# Patient Record
Sex: Male | Born: 1955 | Race: White | Hispanic: No | Marital: Married | Smoking: Current every day smoker
Health system: Southern US, Community
[De-identification: ages and names within clinical notes are randomized; demographics above are authoritative.]

## PROBLEM LIST (undated history)

## (undated) DIAGNOSIS — I10 Essential (primary) hypertension: Secondary | ICD-10-CM

## (undated) DIAGNOSIS — I714 Abdominal aortic aneurysm, without rupture, unspecified: Secondary | ICD-10-CM

## (undated) DIAGNOSIS — E785 Hyperlipidemia, unspecified: Secondary | ICD-10-CM

## (undated) DIAGNOSIS — K859 Acute pancreatitis without necrosis or infection, unspecified: Secondary | ICD-10-CM

## (undated) HISTORY — DX: Hyperlipidemia, unspecified: E78.5

## (undated) HISTORY — DX: Essential (primary) hypertension: I10

## (undated) HISTORY — DX: Abdominal aortic aneurysm, without rupture: I71.4

## (undated) HISTORY — DX: Acute pancreatitis without necrosis or infection, unspecified: K85.90

## (undated) HISTORY — DX: Abdominal aortic aneurysm, without rupture, unspecified: I71.40

---

## 2004-01-08 ENCOUNTER — Inpatient Hospital Stay (HOSPITAL_COMMUNITY): Admission: EM | Admit: 2004-01-08 | Discharge: 2004-01-12 | Payer: Self-pay | Admitting: Emergency Medicine

## 2006-12-24 HISTORY — PX: CHOLECYSTECTOMY: SHX55

## 2007-01-07 ENCOUNTER — Ambulatory Visit: Payer: Self-pay | Admitting: Surgery

## 2007-02-08 ENCOUNTER — Encounter: Admission: RE | Admit: 2007-02-08 | Discharge: 2007-02-08 | Payer: Self-pay | Admitting: Surgery

## 2007-02-11 ENCOUNTER — Ambulatory Visit: Payer: Self-pay | Admitting: Surgery

## 2007-10-07 ENCOUNTER — Ambulatory Visit: Payer: Self-pay | Admitting: Surgery

## 2007-10-07 ENCOUNTER — Encounter: Admission: RE | Admit: 2007-10-07 | Discharge: 2007-10-07 | Payer: Self-pay | Admitting: Surgery

## 2008-04-27 ENCOUNTER — Ambulatory Visit: Payer: Self-pay | Admitting: Surgery

## 2008-11-02 ENCOUNTER — Encounter: Admission: RE | Admit: 2008-11-02 | Discharge: 2008-11-02 | Payer: Self-pay | Admitting: Surgery

## 2008-11-02 ENCOUNTER — Ambulatory Visit: Payer: Self-pay | Admitting: Surgery

## 2008-11-18 ENCOUNTER — Observation Stay (HOSPITAL_COMMUNITY): Admission: RE | Admit: 2008-11-18 | Discharge: 2008-11-19 | Payer: Self-pay | Admitting: Surgery

## 2008-11-25 ENCOUNTER — Ambulatory Visit (HOSPITAL_COMMUNITY): Admission: RE | Admit: 2008-11-25 | Discharge: 2008-11-25 | Payer: Self-pay | Admitting: Surgery

## 2009-02-04 ENCOUNTER — Inpatient Hospital Stay (HOSPITAL_COMMUNITY): Admission: RE | Admit: 2009-02-04 | Discharge: 2009-02-05 | Payer: Self-pay | Admitting: Surgery

## 2009-02-04 ENCOUNTER — Ambulatory Visit: Payer: Self-pay | Admitting: Surgery

## 2009-02-04 HISTORY — PX: ABDOMINAL AORTIC ANEURYSM REPAIR: SUR1152

## 2009-03-08 ENCOUNTER — Encounter: Admission: RE | Admit: 2009-03-08 | Discharge: 2009-03-08 | Payer: Self-pay | Admitting: Surgery

## 2009-03-08 ENCOUNTER — Ambulatory Visit: Payer: Self-pay | Admitting: Surgery

## 2010-05-15 ENCOUNTER — Encounter: Payer: Self-pay | Admitting: Surgery

## 2010-07-28 LAB — COMPREHENSIVE METABOLIC PANEL
ALT: 31 U/L (ref 0–53)
AST: 23 U/L (ref 0–37)
Alkaline Phosphatase: 62 U/L (ref 39–117)
BUN: 28 mg/dL — ABNORMAL HIGH (ref 6–23)
CO2: 13 mEq/L — ABNORMAL LOW (ref 19–32)
Calcium: 9.5 mg/dL (ref 8.4–10.5)
Chloride: 106 mEq/L (ref 96–112)
Creatinine, Ser: 1.33 mg/dL (ref 0.4–1.5)
GFR calc non Af Amer: 56 mL/min — ABNORMAL LOW (ref >60)
Glucose, Bld: 123 mg/dL — ABNORMAL HIGH (ref 70–99)

## 2010-07-28 LAB — BLOOD GAS, ARTERIAL
Bicarbonate: 17.1 mEq/L — ABNORMAL LOW (ref 20.0–24.0)
FIO2: 0.21 %
TCO2: 18.1 mmol/L (ref 0–100)
pH, Arterial: 7.372 (ref 7.350–7.450)
pO2, Arterial: 83.6 mmHg (ref 80.0–100.0)

## 2010-07-28 LAB — GLUCOSE, CAPILLARY: Glucose-Capillary: 125 mg/dL — ABNORMAL HIGH (ref 70–99)

## 2010-07-28 LAB — BASIC METABOLIC PANEL
CO2: 23 mEq/L (ref 19–32)
Calcium: 8.8 mg/dL (ref 8.4–10.5)
Chloride: 103 mEq/L (ref 96–112)
GFR calc Af Amer: >60 mL/min (ref >60)
Potassium: 4.5 mEq/L (ref 3.5–5.1)
Sodium: 133 mEq/L — ABNORMAL LOW (ref 135–145)

## 2010-07-28 LAB — CBC
HCT: 36.4 % — ABNORMAL LOW (ref 39.0–52.0)
HCT: 39.6 % (ref 39.0–52.0)
Hemoglobin: 12.4 g/dL — ABNORMAL LOW (ref 13.0–17.0)
MCHC: 34.1 g/dL (ref 30.0–36.0)
MCV: 84.6 fL (ref 78.0–100.0)
MCV: 85 fL (ref 78.0–100.0)
Platelets: 362 10*3/uL (ref 150–400)
RBC: 4.28 MIL/uL (ref 4.22–5.81)
RBC: 4.68 MIL/uL (ref 4.22–5.81)
RDW: 13.7 % (ref 11.5–15.5)
WBC: 19.8 10*3/uL — ABNORMAL HIGH (ref 4.0–10.5)

## 2010-07-28 LAB — URINALYSIS, ROUTINE W REFLEX MICROSCOPIC
Glucose, UA: NEGATIVE mg/dL
Nitrite: NEGATIVE
pH: 5 (ref 5.0–8.0)

## 2010-07-28 LAB — PROTIME-INR: INR: 0.94 (ref 0.00–1.49)

## 2010-07-28 LAB — TYPE AND SCREEN: ABO/RH(D): A POS

## 2010-07-30 LAB — CBC
HCT: 37.6 % — ABNORMAL LOW (ref 39.0–52.0)
MCV: 85.5 fL (ref 78.0–100.0)
Platelets: 348 10*3/uL (ref 150–400)
RDW: 13.8 % (ref 11.5–15.5)

## 2010-07-30 LAB — URINALYSIS, ROUTINE W REFLEX MICROSCOPIC
Nitrite: NEGATIVE
Specific Gravity, Urine: 1.009 (ref 1.005–1.030)
Urobilinogen, UA: 0.2 mg/dL (ref 0.0–1.0)

## 2010-07-30 LAB — TYPE AND SCREEN
ABO/RH(D): A POS
Antibody Screen: NEGATIVE

## 2010-07-30 LAB — BLOOD GAS, ARTERIAL
Patient temperature: 98.6
TCO2: 20.4 mmol/L (ref 0–100)
pH, Arterial: 7.408 (ref 7.350–7.450)

## 2010-07-30 LAB — COMPREHENSIVE METABOLIC PANEL
Albumin: 3.6 g/dL (ref 3.5–5.2)
BUN: 11 mg/dL (ref 6–23)
Calcium: 9.5 mg/dL (ref 8.4–10.5)
Creatinine, Ser: 1.05 mg/dL (ref 0.4–1.5)
Potassium: 4.3 mEq/L (ref 3.5–5.1)
Total Protein: 6.9 g/dL (ref 6.0–8.3)

## 2010-07-30 LAB — PROTIME-INR: INR: 0.9 (ref 0.00–1.49)

## 2010-07-30 LAB — APTT: aPTT: 27 seconds (ref 24–37)

## 2010-07-31 LAB — CBC
HCT: 37.1 % — ABNORMAL LOW (ref 39.0–52.0)
HCT: 37.4 % — ABNORMAL LOW (ref 39.0–52.0)
Hemoglobin: 12.2 g/dL — ABNORMAL LOW (ref 13.0–17.0)
Hemoglobin: 12.8 g/dL — ABNORMAL LOW (ref 13.0–17.0)
Hemoglobin: 12.9 g/dL — ABNORMAL LOW (ref 13.0–17.0)
MCHC: 34.3 g/dL (ref 30.0–36.0)
MCV: 85.7 fL (ref 78.0–100.0)
MCV: 85.8 fL (ref 78.0–100.0)
MCV: 86.8 fL (ref 78.0–100.0)
Platelets: 352 10*3/uL (ref 150–400)
RBC: 4.1 MIL/uL — ABNORMAL LOW (ref 4.22–5.81)
RBC: 4.36 MIL/uL (ref 4.22–5.81)
RDW: 13.7 % (ref 11.5–15.5)
WBC: 16.5 10*3/uL — ABNORMAL HIGH (ref 4.0–10.5)
WBC: 16.7 10*3/uL — ABNORMAL HIGH (ref 4.0–10.5)

## 2010-07-31 LAB — BASIC METABOLIC PANEL
CO2: 23 mEq/L (ref 19–32)
Calcium: 9 mg/dL (ref 8.4–10.5)
Chloride: 106 mEq/L (ref 96–112)
Creatinine, Ser: 1.02 mg/dL (ref 0.4–1.5)
GFR calc Af Amer: >60 mL/min (ref >60)
Glucose, Bld: 129 mg/dL — ABNORMAL HIGH (ref 70–99)

## 2010-07-31 LAB — BLOOD GAS, ARTERIAL
Acid-base deficit: 4.4 mmol/L — ABNORMAL HIGH (ref 0.0–2.0)
Bicarbonate: 19.6 mEq/L — ABNORMAL LOW (ref 20.0–24.0)
Patient temperature: 98.6
TCO2: 20.5 mmol/L (ref 0–100)
pCO2 arterial: 32.1 mmHg — ABNORMAL LOW (ref 35.0–45.0)

## 2010-07-31 LAB — URINALYSIS, ROUTINE W REFLEX MICROSCOPIC
Bilirubin Urine: NEGATIVE
Hgb urine dipstick: NEGATIVE
Ketones, ur: NEGATIVE mg/dL
Nitrite: NEGATIVE
Protein, ur: NEGATIVE mg/dL
Specific Gravity, Urine: 1.019 (ref 1.005–1.030)
Urobilinogen, UA: 0.2 mg/dL (ref 0.0–1.0)

## 2010-07-31 LAB — DIFFERENTIAL
Eosinophils Absolute: 0.3 10*3/uL (ref 0.0–0.7)
Eosinophils Relative: 2 % (ref 0–5)
Lymphocytes Relative: 24 % (ref 12–46)
Lymphs Abs: 4 10*3/uL (ref 0.7–4.0)
Monocytes Relative: 8 % (ref 3–12)

## 2010-07-31 LAB — GLUCOSE, CAPILLARY: Glucose-Capillary: 140 mg/dL — ABNORMAL HIGH (ref 70–99)

## 2010-07-31 LAB — PROTIME-INR
INR: 0.9 (ref 0.00–1.49)
Prothrombin Time: 12.5 seconds (ref 11.6–15.2)

## 2010-07-31 LAB — COMPREHENSIVE METABOLIC PANEL
Albumin: 3.7 g/dL (ref 3.5–5.2)
BUN: 23 mg/dL (ref 6–23)
Chloride: 106 mEq/L (ref 96–112)
Creatinine, Ser: 1.13 mg/dL (ref 0.4–1.5)
GFR calc non Af Amer: >60 mL/min (ref >60)
Glucose, Bld: 147 mg/dL — ABNORMAL HIGH (ref 70–99)
Total Bilirubin: 0.7 mg/dL (ref 0.3–1.2)

## 2010-07-31 LAB — APTT: aPTT: 30 seconds (ref 24–37)

## 2010-09-06 NOTE — Assessment & Plan Note (Signed)
OFFICE VISIT   George Webb, George Webb  DOB:  1956-02-27                                       04/27/2008  JXBJY#:[id number]   REASON FOR VISIT:  Follow-up aneurysm.   HISTORY:  This is a 55 year old gentleman with asymptomatic abdominal  aortic aneurysm that I initially met in October of 2008.  We have been  following this, he comes in today with a CT scan.  He remains  asymptomatic.   PHYSICAL EXAMINATION:  Blood pressure is 101/69, pulse is 109.  General:  He is well-appearing, in no distress.  Abdomen:  Soft, no pulsatile mass  felt, he is obese.  His extremities are warm and well-perfused.   DIAGNOSTIC STUDIES:  The patient comes in with a report from a CT scan  obtained at [facility].  This reveals maximum aneurysm diameter  of 5 cm, infrarenal neck measures 3.2 cm.   ASSESSMENT:  Infrarenal abdominal aneurysm.   PLAN:  Based on the patient's size of his aneurysm I do not recommend  repair at this time.  I told him he has the risk of approximately 1%  risk of rupture over the next year based on the size of his aneurysm.  I  think that we are better off following this as it has been stable over  time.  He is told if he has significant abdominal or back pain to go  straight to the emergency department.   I think the patient is a candidate for either endovascular or open  repair, however, given his body habitus I think he would do better with  an endovascular repair.  I have encouraged him to start an exercise  program, eat healthier and try to lose weight.  I will see him back in 6  months with a CT angiogram.   Jorge Ny, MD  Electronically Signed   VWB/MEDQ  D:  04/27/2008  T:  04/28/2008  Job:  [id number]   cc:   Fara Chute

## 2010-09-06 NOTE — Assessment & Plan Note (Signed)
OFFICE VISIT   JAWAD, WIACEK  DOB:  05-23-1955                                       10/07/2007  WNUUV#:[id number]   REASON FOR VISIT:  Followup aneurysm.   HISTORY:  This is a 55 year old gentleman with asymptomatic abdominal  aortic aneurysm.  He comes back in today for followup and review of his  CT scan.  Since I last saw him, he has not had any symptoms such as  abdominal pain, chest pain.   PHYSICAL EXAMINATION:  His blood pressure is 119/74, pulse 96,  respirations 82.  General:  He is well-appearing in no acute distress.  Abdomen:  Soft, nontender.  Extremities are warm and well-perfused.   DIAGNOSTIC STUDIES:  The patient had a CT scan today.  This was  reviewed.  His aneurysm in short axis measures 45 mm and long axis  measuring 49 mm.   ASSESSMENT/PLAN:  Infrarenal abdominal aneurysm.   PLAN:  At this time, I do not feel like the patient meets size criteria  to proceed with endovascular versus open repair of his aneurysm.  For  that reason, I am scheduling him to have a repeat CT scan in 6 months.  The patient is told that if he has any severe abdominal pain, that he  should contact EMS.  I told him the risk of his aneurysm rupturing  within the next year is less than 1%.  Again, will follow up in 6 months  with a repeat CT scan.   Jorge Ny, MD  Electronically Signed   VWB/MEDQ  D:  10/07/2007  T:  10/08/2007  Job:  [id number]

## 2010-09-06 NOTE — Assessment & Plan Note (Signed)
OFFICE VISIT   BECKET, WECKER  DOB:  1955-06-18                                       01/07/2007  ZOXWR#:[id number]   REASON FOR VISIT:  Abdominal aortic aneurysm.   HISTORY:  This is a 55 year old gentleman who had been complaining of  epigastric pain, underwent ultrasound for biliary pathology and an  incidental finding was a 4.29x4.45 cm abdominal aortic aneurysm.  The  patient subsequently had his gallbladder out.  He does not have  complaint of abdominal pain consistent with a symptomatic aneurysm.   REVIEW OF SYSTEMS:  Loss of appetite and weight loss.  He weighs 276  pounds.  CARDIAC:  No chest pain, tightness, or shortness of breath.  GASTROINTESTINAL:  He complains of reflux, diarrhea, and constipation,  which is long-standing.  VASCULAR:  No evidence of TIAs.  NEURO:  No seizures.   PAST MEDICAL HISTORY:  Diabetes, high blood pressure,  hypercholesterolemia, psoriasis, pancreatitis.   PAST SURGICAL HISTORY:  Lap-chole.  Scrotal abscess drainage.  Right ear  surgery.  Right knee surgery.   FAMILY HISTORY:  Negative for cardiovascular disease.   SOCIAL HISTORY:  Married with 2 children.  Works as a Therapist, sports.  Currently does not smoke, but has a history of tobacco.  Does not drink  alcohol.   PHYSICAL EXAM:  Heart rate is 100, blood pressure 149/97.  In general,  well-appearing, in no acute distress.  HEENT is normocephalic,  atraumatic.  No carotid bruits.  Cardiovascular is regular rate and  rhythm.  Respirations are not labored.  Lungs are clear.  Abdomen is  soft and nontender.  Obese.  Extremities, he has palpable femoral  pulses.  Extremities were warm with no ulceration.   ASSESSMENT AND PLAN:  This is a 55 year old with a 4.4 cm abdominal  aortic aneurysm.  Plan:  At this time, the patient is asymptomatic.  He  needs to be further evaluated with a CT angiogram.  Would also like to  evaluate his lower extremities for popliteal  aneurysms and femoral  aneurysms.  Will also get a carotid duplex. These should all be  scheduled in the next month, and the patient will follow up after that.  We discussed the signs of rupture or symptomatic pain, and he will come  to the emergency room if either of these occur.   Jorge Ny, MD  Electronically Signed   VWB/MEDQ  D:  01/07/2007  T:  01/08/2007  Job:  [id number]

## 2010-09-06 NOTE — Assessment & Plan Note (Signed)
OFFICE VISIT   Cowgill, Montrey L  DOB:  08-17-1955                                       03/08/2009  XBJYN#:[id number]   REASON FOR VISIT:  Follow up aneurysm.   HISTORY:  This is a 55 year old gentleman who is status post  endovascular aneurysm repair on 02/04/09.  His postoperative course was  uncomplicated.  He was discharged home the following day.  He comes back  in today for his first postoperative visit.  CT scan was performed  earlier today.  I do not see any endoleak.   On examination, he has a slight wound separation, 2 mm at most, on the  lateral aspect of his left groin.  There is no evidence of infection.  His extremities are warm and well-perfused.  His abdomen is soft.   At this time, I will have the patient come back to see me in 6 months.  We will get an abdominal ultrasound at that time with the plan for CT  scan in 1 year.  I told him to keep the groin area dry.  He can place  antibiotic ointment on it if he wants.  I do not think this is going to  be a problem for him.  He will call me if it becomes red or inflamed.   Jorge Ny, MD  Electronically Signed   VWB/MEDQ  D:  03/08/2009  T:  03/09/2009  Job:  [id number]

## 2010-09-06 NOTE — Assessment & Plan Note (Signed)
OFFICE VISIT   George Webb, George Webb  DOB:  18-Aug-1955                                       11/02/2008  [id number]#:[id number]   REASON FOR VISIT:  Follow up aneurysm.   HISTORY:  This is a 55 year old gentleman that I have been following  with an asymptomatic abdominal aortic aneurysm.  He comes back in today  with followup CT scanning.  Denies having any abdominal pain.  He has  been doing quite well.   The patient denies chest pain.  He did say he underwent an  echocardiogram approximately in January of this year for evaluation of a  job.  He denies any neurologic symptoms, specifically no amaurosis  fugax, numbness or weakness in either extremity.   REVIEW OF SYSTEMS:  Negative on 12-point review.   PAST MEDICAL HISTORY:  Diabetes, hypertension, hypercholesterolemia,  psoriasis, pancreatitis.   PAST SURGICAL HISTORY:  Laparoscopic cholecystectomy, scrotal abscess  drainage, right ear surgery, right knee surgery.   FAMILY HISTORY:  Negative for cardiovascular disease.   SOCIAL HISTORY:  He is married with 2 children.  Does not smoke.  Has a  history of smoking.  Does not drink alcohol.   MEDICATIONS:  Include metformin, lisinopril, simvastatin and aspirin.   ALLERGIES:  None.   PHYSICAL EXAMINATION:  Blood pressure is 136/86, pulse is 76.  General:  He is well-appearing, in no distress.  HEENT:  Normocephalic,  atraumatic.  Pupils equal.  Sclerae anicteric.  Neck:  Supple.  No JVD.  No carotid bruits.  Cardiovascular:  Regular rate and rhythm.  Pulmonary:  Lungs are clear bilaterally.  Abdomen is obese but soft, no  hepatosplenomegaly.  Extremities:  Feet are warm and well-perfused.  He  has palpable posterior tibial pulses.   DIAGNOSTIC STUDIES:  CT angiogram today reveals 5.5-cm infrarenal  abdominal aortic aneurysm.   The patient has a carotid ultrasound, which reveals minimal stenosis  bilaterally.   ASSESSMENT:  Infrarenal abdominal aortic  aneurysm.   PLAN:  I discussed the risks and benefits of open versus endovascular  repair.  We have elected to proceed with an endovascular repair.  We  discussed the risks of this including risk of bleeding, cardiac trouble,  embolization to his legs and gut.  He understands all these risks and  wishes to proceed.  We will plan on doing his procedure on Wednesday,  July 28.   Jorge Ny, MD  Electronically Signed   VWB/MEDQ  D:  11/02/2008  T:  11/03/2008  Job:  [id number]   cc:   Fara Chute  Dr. Leavy Cella

## 2010-09-06 NOTE — Procedures (Signed)
CAROTID DUPLEX EXAM   INDICATION:  Carotid artery disease.   HISTORY:  Diabetes:  Yes  Cardiac:  No  Hypertension:  Yes  Smoking:  Cigars  Previous Surgery:  No  CV History:  No  Amaurosis Fugax:  No, Paresthesias:  No, Hemiparesis:  No                                       RIGHT             LEFT  Brachial systolic pressure:         126               120  Brachial Doppler waveforms:         Triphasic         Triphasic  Vertebral direction of flow:        Antegrade         Antegrade  DUPLEX VELOCITIES (cm/sec)  CCA peak systolic                   74                118  ECA peak systolic                   129               143  ICA peak systolic                   75                74  ICA end diastolic                   33                30  PLAQUE MORPHOLOGY:                  Mixed             None  PLAQUE AMOUNT:                      Minimal           None  PLAQUE LOCATION:                    Bifurcation       None   IMPRESSION:  The right internal carotid artery shows evidence of 1% to  19% stenosis.   The left internal carotid artery shows no evidence of plaque.   ___________________________________________  V. Charlena Cross, MD   AS/MEDQ  D:  02/11/2007  T:  02/12/2007  Job:  [id number]

## 2010-09-06 NOTE — Procedures (Signed)
VASCULAR LAB EXAM   INDICATION:  Abdominal aortic aneurysm.   HISTORY:  Diabetes:  Yes.  Cardiac:  No.  Hypertension:  Yes.   EXAM:  Bilateral common femoral and popliteal artery aneurysm  evaluation.   IMPRESSION:  1. Bilateral common femoral and popliteal arteries were imaged,      measured and show no evidence of aneurysmal dilatation.  2. Note, evidence of diffuse plaque noted bilaterally, triphasic flow      noted bilaterally.  3. Common femoral artery:  R:P=0.99 cm, M=1.15 cm, D=1.06 cm.      L:P=0.98 cm, M=1.07 cm, D=1.06 cm.  4. Popliteal artery:  R:P=0.77 cm, M=0.77 cm, D=0.70 cm, L:P=0.85 cm,      M=0.80 cm, D=0.71 cm.   ___________________________________________  V. Charlena Cross, MD   AS/MEDQ  D:  02/11/2007  T:  02/12/2007  Job:  [id number]

## 2010-09-06 NOTE — Assessment & Plan Note (Signed)
OFFICE VISIT   George Webb, George Webb  DOB:  Dec 27, 1955                                       02/11/2007  ZOXWR#:[id number]   REASON FOR VISIT:  Follow up studies.   HISTORY:  The patient is a 55 year old gentleman whom I saw  approximately a month ago for evaluation of a 4 cm aneurysm.  He was  asymptomatic at that time.  He was scheduled for a dedicated CT  angiogram as well as a duplex evaluation of his lower extremity of his  femoral popliteal arteries for aneurysm as well as for carotid disease.  He comes back in today for followup and discussion of these studies.  He  has had no change in his symptoms since his prior visit.  Specifically,  he has not had any chest pain, shortness of breath, or abdominal pain.  He does have continued weight loss, which is secondary to diet.   PHYSICAL EXAMINATION:  Pulse 104, respirations 18, blood pressure  131/81.  In general, he is obese, but in no acute distress.  Cardiovascular is regular rate and rhythm.  Lungs are clear to  auscultation bilaterally.  Abdomen is soft and nontender.  No palpable  mass appreciated.  Healing cholecystectomy scars.  Extremities are warm  and well perfused.   DIAGNOSTIC STUDIES:  1. Lower extremity duplex:  Bilateral common femoral and popliteal      arteries show no evidence of aneurismal degeneration.  2. Carotid duplex, right side, right internal shows 1-19% stenosis,      left internal shows no evidence of plaque.  3. CT and angiogram obtained today, which reveal 4.5 cm infrarenal      abdominal aortic aneurysm.  There was a long infrarenal neck.   ASSESSMENT AND PLAN:  This is a 55 year old gentleman with a 4.5 cm  abdominal aortic aneurysm, which is asymptomatic.   PLAN:  At this time, I do not feel the patient is at a size which  requires repair.  I would like to wait until his aneurysm becomes 5.5 cm  before we repair this.  At this time, I feel like he has the option of  an  open versus an endovascular approach.  He will need to make that  decision when it becomes time for his repair, but I think he is a  candidate for either.  We spent some time today discussing the risks and  benefits of each today.  He is scheduled to come back to see me in the  clinic in 6 months' time.  He will also have a repeat CT angiogram prior  to his visit.   Jorge Ny, MD  Electronically Signed   VWB/MEDQ  D:  02/11/2007  T:  02/12/2007  Job:  [id number]

## 2010-09-09 NOTE — Op Note (Signed)
NAMEBARCLAY, George                          ACCOUNT NO.:  [id number]   MEDICAL RECORD NO.:  [id number]                   PATIENT TYPE:  INP   LOCATION:  5714                                 FACILITY:  [facility]   PHYSICIAN:  Claudette Laws, M.D.               DATE OF BIRTH:  10-Jan-1956   DATE OF PROCEDURE:  DATE OF DISCHARGE:                                 OPERATIVE REPORT   DATE OF SURGERY:  January 08, 2004.   PREOPERATIVE DIAGNOSES:  Scrotal hematoma and scrotal abscess.   POSTOPERATIVE DIAGNOSES:  Scrotal hematoma and scrotal abscess.   OPERATION:  Incision and drainage of scrotal abscess.   SURGEON:  Claudette Laws, MD.   HISTORY:  This is a 55 year old otherwise healthy man, who presented in the  emergency room about midnight last night with a painful swelling of the  scrotum.  Apparently by history, he has had some swelling for a few days  prior, and then he apparently tried to drain the scrotum.  He got some pus  out.  Then over the last day or two, the swelling got worse along with some  redness, and he presented to the emergency room.  I saw him last night.  We  started him on Primax, and a scrotal ultrasound was obtained showing some  possible abscess formation along with edema of the skin.  I saw him a few  hours later, he was no better on soaks, and so a decision was made to take  him to the operating room for an incision and drainage.  I explained the  procedure to him, he understands, and agrees to the proposed surgery.  He  has not had anything to eat or drink now for over 12 hours.  His white cell  count was 24,000 in the emergency room.   PROCEDURE:  The patient was prepped and draped in the supine position under  intubated general anesthesia.  A knife was used to incise the scrotum in the  midline over the ultrasonic findings, and immediately there was a hematoma-  like formation about 3 cm in circumference just below the scrotal skin.  Then we opened up  the scrotum a little more deeper and indeed get into a pus  pocket and drained out about 50 to 75 mL of grossly purulent urine.  Cultures were sent for anaerobic and aerobic cultures.  We then irrigated  out the abscess cavity, and then two Penrose drains were placed in the  dependent portion of the scrotum on either side of the scrotal incision.  I  then packed the incision with Iodoform, sewed the Penrose drains to the skin  with chromic sutures, a Fluff dressing was applied along with mesh panty.  The patient was then taken back to the PACU after a rather difficult  extubation.      RFS/MEDQ  D:  01/08/2004  T:  01/10/2004  Job:  [id number]

## 2010-09-09 NOTE — Discharge Summary (Signed)
George Webb, George Webb              ACCOUNT NO.:  [id number]   MEDICAL RECORD NO.:  [id number]          PATIENT TYPE:  INP   LOCATION:  5714                         FACILITY:  [facility]   PHYSICIAN:  Claudette Laws, M.D.  DATE OF BIRTH:  [birth year]   DATE OF ADMISSION:  01/07/2004  DATE OF DISCHARGE:  01/12/2004                                 DISCHARGE SUMMARY   HISTORY:  This is a 55 year old man whom I saw late in the evening on  January 06, 2004 in the [facility] emergency room with a several-day  history of swelling, pain and redness of the scrotum.  When we saw him in  the emergency room the had a white count of 24,000 and it was apparent that  he had an infectious process going on in the scrotum.  Subsequent scrotal  ultrasound confirmed an abscess as well as a hematoma.  The patient is  admitted for further treatment.   PAST MEDICAL HISTORY:  Essentially unremarkable; basically in good health.  He does have psoriasis.  No diabetes.   ALLERGIES:  No known drug allergies.   His BUN and creatinine were normal.   PERTINENT LABORATORY DATA:  The patient's initial white cell count was  24,700 and one day prior to discharge it had dropped down to 11,300,  hemoglobin 14.5 and hematocrit 40.7.  Interestingly, is wound culture showed  no growth.  No anaerobes were isolated.   X-rays:  Chest x-ray essentially unremarkable; some right basilar scarring.  The scrotal ultrasound as mentioned above showed heteroechoicoic mass-like  regions in the regions of the scrotum septum.  EKG; normal sinus rhythm.   COURSE IN THE HOSPITAL:  I saw the patient in the emergency room.  He was  admitted and then taken to surgery later on that day where he underwent  incision and drainage of a scrotal abscess.  Two drains were out in, which I  removed on the afternoon of the third postop day.  He was started on  primaxin and physical theraoy was brought in for pulse lavage.  The  temperature came down  as did his white count.  There appeared to be rapid  granulation of the scrotal incision.  He was then discharged on January 12, 2004.  His wife is an LPN and she will continue to pack the incision.  He was instructed in sitz baths, otherwise I would like to see him back in  one week for follow up.  I anticipate gradual resolution of this  inflammatory process.   DISCHARGE DIAGNOSES:  1.  Scrotal abscess.  2.  Psoriasis.  3.  Exogenous obesity.   OPERATION PERFORMED:  Incision and drainage of a scrotal abscess on  January 08, 2004.   COMPLICATIONS:  None.   CONDITION ON DISCHARGE:  Recovering.   DISCHARGE MEDICATIONS:  Discharge medications to include:  1.  Cephalexin or a generic Keflex 1 q.i.d. #28, 250 mg capsule.  2.  Tylox #50 p.r.n. pain.   DISPOSITION:  1.  Regular diet.  2.  Limited activity.  3.  Hot sitz baths daily and  packing.  4.  Return to the office to see me in one week for follow up.        ___________________________________________  Claudette Laws, M.D.    RFS/MEDQ  D:  01/12/2004  T:  01/12/2004  Job:  [id number]

## 2010-09-09 NOTE — H&P (Signed)
George Webb, STUMPE                          ACCOUNT NO.:  [id number]   MEDICAL RECORD NO.:  [id number]                   PATIENT TYPE:  INP   LOCATION:  5714                                 FACILITY:  [facility]   PHYSICIAN:  Claudette Laws, M.D.               DATE OF BIRTH:  01-07-1956   DATE OF ADMISSION:  01/07/2004  DATE OF DISCHARGE:                                HISTORY & PHYSICAL   CHIEF COMPLAINT:  Scrotal pain.   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  He noticed a slight discharge the other day.  He then  presented to the urgent medical care last night and then went to the [facility] ER where I was called in to see him.  He was noted to have a white cell  count of 24,000 and on exam had a swollen right hemiscrotum, slight  erythema.  No obvious fluctuance in the area.  The patient has a past  history of recurrent boils or possible staph infections.  He has no  trouble urinating.  No blood in the urine.  No history of any injury.  The  patient himself is otherwise in good health.   By review of systems, he has no known drug allergies.   He does not have a primary care doctor per se.   He has had knee and ear surgery in the past.   He takes no routine medications.   He does have psoriasis.   The patient is here with his wife who is an LPN.   PHYSICAL EXAMINATION:  GENERAL:  He is a large man, moderate distress.  VITAL SIGNS:  His BP is 130/53, pulse is 106, respirations 24, temperature  is 100.8.  HEENT:  Unremarkable.  No obvious nodes, although there is a small indurated  subcutaneous lesion under the right chin.  CHEST:  Clear to auscultation and percussion.  No rales.  HEART:  Regular rhythm.  No obvious murmurs.  ABDOMEN:  Somewhat benign.  Protuberant.  No obvious hernias or  organomegaly.  GENITALIA:  A circumcised male, normal  penis and meatus.  The scrotum  enlarged, swollen, right hemiscrotum.  Tender to palpation.  No obvious  fluctuant area.  Left testicle is normal.  No obvious gangrenous type areas.  Perineum is normal to appearance.  SKIN:  He has psoriasis present.  NEUROLOGIC:  Intact.  He is oriented as to time, place, and person.  LYMPHATIC:  No obvious nodes in the neck or groin area.   LABORATORY WORK:  His urine was clear with no obvious bacteruria.  His  hemoglobin 14.5, hematocrit 40.7.  His electrolytes were normal with a BUN  of 9, a creatinine of 1.0.   We then proceed to a scrotal ultrasound that showed a hypoechoic area  in the  septal area right over the point where he is tender, some diffuse hyperemia  and edema of the scrotal skin.   IMPRESSION:  1.  Acute painful swelling right hemiscrotum rule out cellulitis, rule out      scrotal abscess, rule out a staph abscess.  2.  History of psoriasis.  3.  __________  .   DISCUSSION:  I went over the findings with he and his wife and we admitted  to the hospital and started him on IV antibiotics as well as warm compresses  and we will observe him over the next day or two and I told him that there  is a possibility we may need to drain the scrotum if it does not respond to  the above therapy.  He understands and agrees to the proposed treatment.                                                Claudette Laws, M.D.    RFS/MEDQ  D:  01/08/2004  T:  01/09/2004  Job:  [id number]

## 2010-09-20 IMAGING — CR DG CHEST 2V
2 series · 2 of 2 positions shown · non-contrast
Comparison: 11/16/2008 study.

CLINICAL DATA: History of tobacco smoking.  Preoperative
cardiopulmonary evaluation.

CHEST - 2 VIEW

[view not recorded (1 of 2)]
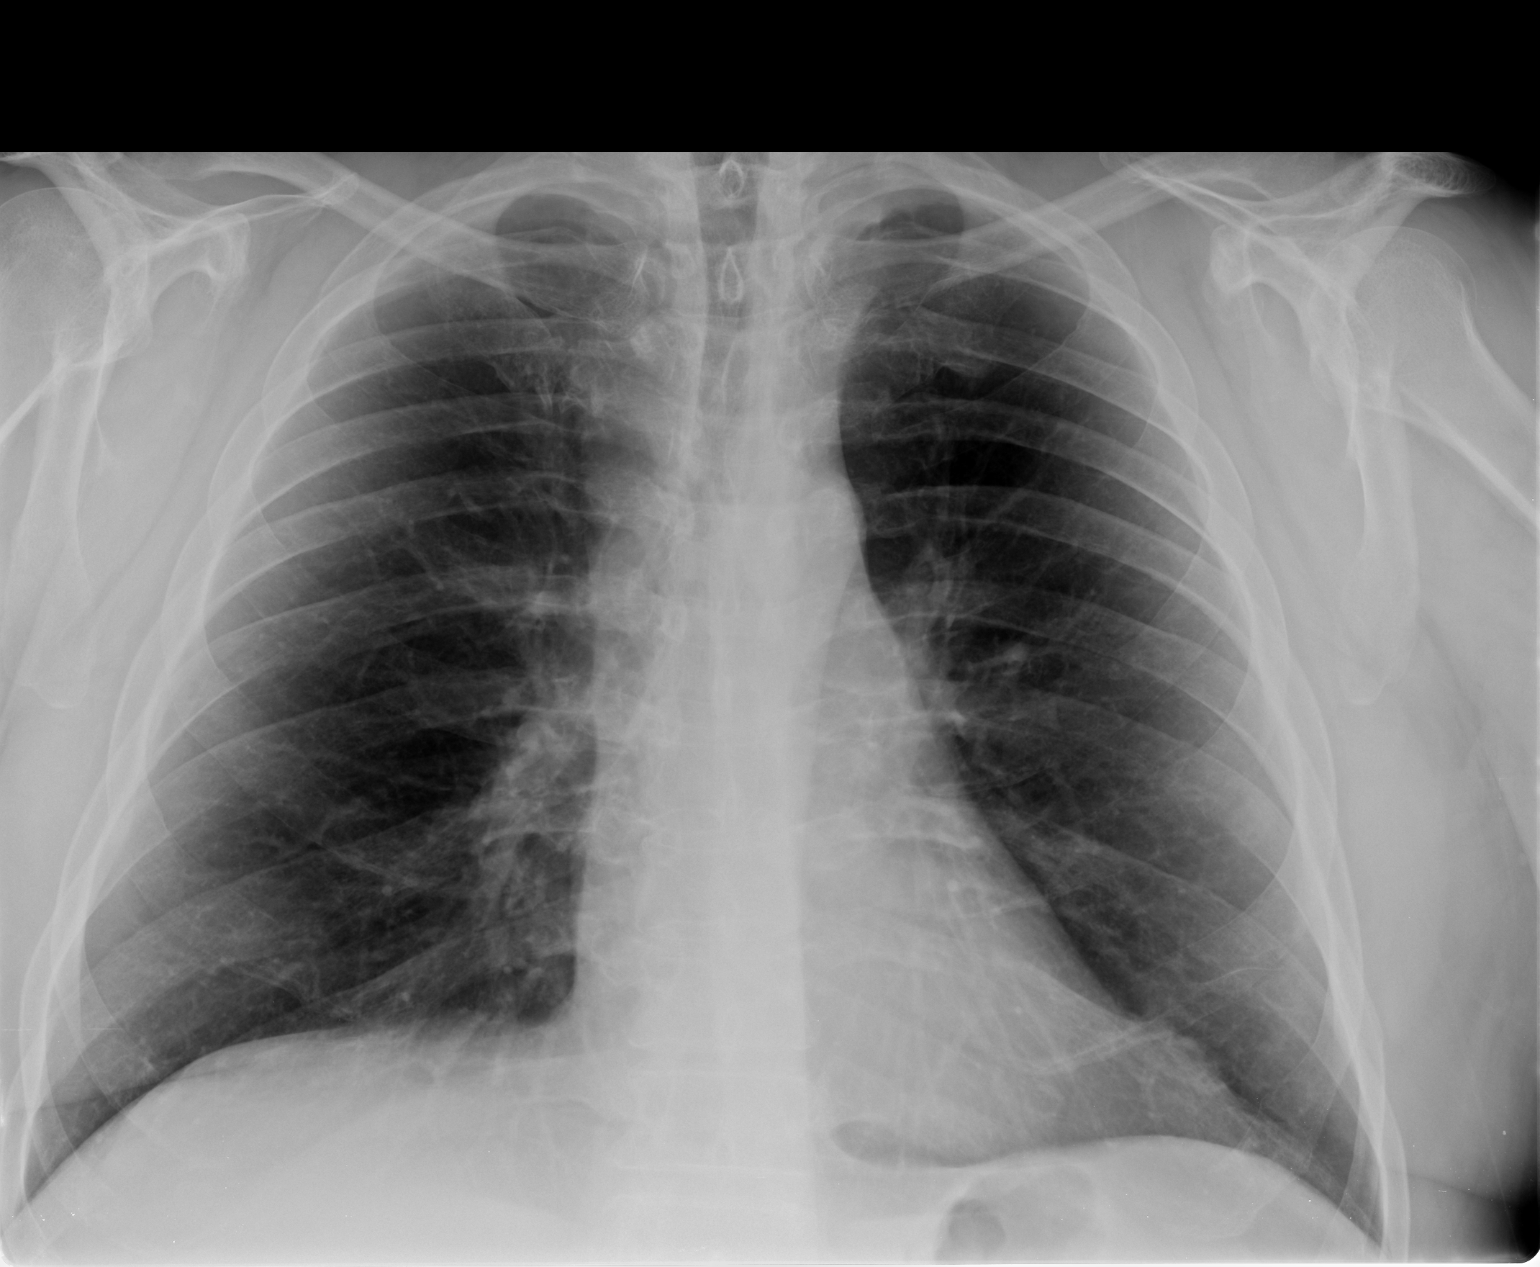

[view not recorded (2 of 2)]
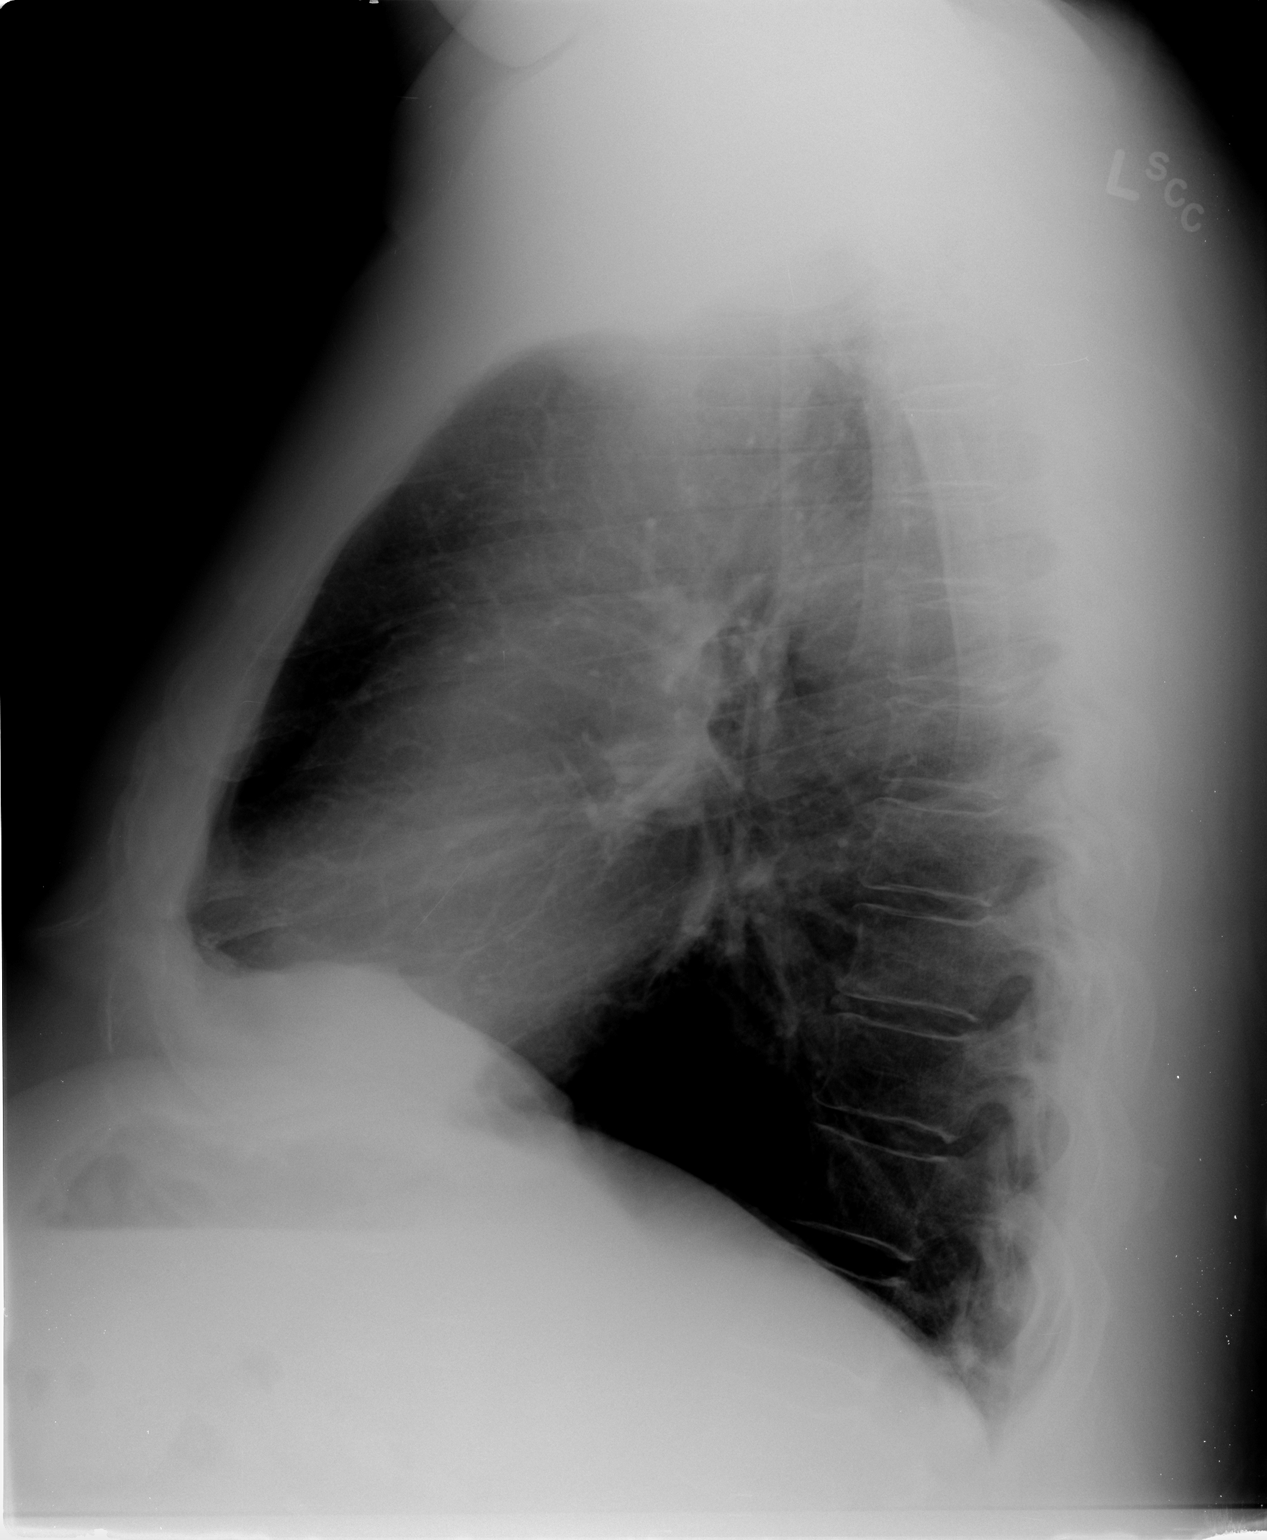

[2 of 2 positions shown; findings below may reference images not displayed]

FINDINGS: The cardiac silhouette is normal size and shape. No
pleural abnormality is evident. There is flattening of diaphragm on
lateral view consistent with an element of hyperinflation and
obstructive pulmonary disease.
No pulmonary infiltrates are seen.  Bones are osteopenic in
appearance.
IMPRESSION: There is generalized hyperinflation configuration consistent with
obstructive pulmonary disease.  No pneumonia or other acute process
is identified.

## 2011-08-18 ENCOUNTER — Other Ambulatory Visit: Payer: Self-pay | Admitting: *Deleted

## 2011-08-18 DIAGNOSIS — Z48812 Encounter for surgical aftercare following surgery on the circulatory system: Secondary | ICD-10-CM

## 2011-08-18 DIAGNOSIS — I714 Abdominal aortic aneurysm, without rupture: Secondary | ICD-10-CM

## 2011-10-02 ENCOUNTER — Encounter: Payer: Self-pay | Admitting: Surgery

## 2011-10-09 ENCOUNTER — Ambulatory Visit: Payer: Self-pay | Admitting: Surgery

## 2011-10-09 ENCOUNTER — Other Ambulatory Visit: Payer: Self-pay

## 2011-10-13 ENCOUNTER — Other Ambulatory Visit: Payer: Self-pay | Admitting: Surgery

## 2011-10-13 ENCOUNTER — Encounter: Payer: Self-pay | Admitting: Surgery

## 2011-10-14 LAB — CREATININE, SERUM: Creat: 1.02 mg/dL (ref 0.50–1.35)

## 2011-10-16 ENCOUNTER — Encounter: Payer: Self-pay | Admitting: Surgery

## 2011-10-16 ENCOUNTER — Ambulatory Visit (INDEPENDENT_AMBULATORY_CARE_PROVIDER_SITE_OTHER): Payer: BC Managed Care – PPO | Admitting: Surgery

## 2011-10-16 ENCOUNTER — Ambulatory Visit
Admission: RE | Admit: 2011-10-16 | Discharge: 2011-10-16 | Disposition: A | Discharge status: Home / Self Care | Payer: BC Managed Care – PPO | Source: Ambulatory Visit | Attending: Surgery | Admitting: Surgery

## 2011-10-16 VITALS — BP 146/80 | HR 78 | Resp 16 | Ht 72.0 in | Wt 274.1 lb

## 2011-10-16 DIAGNOSIS — I714 Abdominal aortic aneurysm, without rupture, unspecified: Secondary | ICD-10-CM | POA: Insufficient documentation

## 2011-10-16 DIAGNOSIS — Z48812 Encounter for surgical aftercare following surgery on the circulatory system: Secondary | ICD-10-CM

## 2011-10-16 MED ORDER — IOHEXOL 350 MG/ML SOLN
100.0000 mL | Freq: Once | INTRAVENOUS | Status: AC | PRN
  Administered 2011-10-16: 100 mL via INTRAVENOUS

## 2011-10-16 NOTE — Addendum Note (Signed)
Addended by: Sharee Pimple on: 10/16/2011 02:16 PM   Modules accepted: Orders

## 2011-10-16 NOTE — Progress Notes (Signed)
[facility] [place]   Patient name: George Webb MRN: [id number] DOB: 1955/09/26 Sex: male     Chief Complaint  Patient presents with  . AAA    f/up    with CTA at [facility].    HISTORY OF PRESENT ILLNESS: The patient is back today for followup. It has been over 2-1/2 years since I last saw him. He is status post endovascular repair of an abdominal aortic aneurysm and October of 2010. A Gore Excluder device was placed for a 5 and half centimeter aneurysm. He denies any complaints at this time. He has no abdominal pain or leg pain. He denies any symptoms of neurologic compromise.  Past Medical History  Diagnosis Date  . Diabetes mellitus   . Hypertension   . Hyperlipidemia   . Pancreatitis     Past Surgical History  Procedure Date  . Abdominal aortic aneurysm repair 02-04-2009    EVAR  . Cholecystectomy Sept. 2008    History   Social History  . Marital Status: Married    Spouse Name: N/A    Number of Children: N/A  . Years of Education: N/A   Occupational History  . Not on file.   Social History Main Topics  . Smoking status: Current Everyday Smoker -- 1.0 packs/day for 20 years    Types: Cigars  . Smokeless tobacco: Not on file  . Alcohol Use: 0.6 oz/week    1 Glasses of wine per week  . Drug Use: No  . Sexually Active: Not on file   Other Topics Concern  . Not on file   Social History Narrative  . No narrative on file    Family History  Problem Relation Age of Onset  . Heart disease Mother   . Diabetes Father   . Cancer Sister     Allergies as of 10/16/2011  . (No Known Allergies)    Current Outpatient Prescriptions on File Prior to Visit  Medication Sig Dispense Refill  . fish oil-omega-3 fatty acids 1000 MG capsule Take 2 g by mouth daily.      Marland Kitchen gabapentin (NEURONTIN) 300 MG capsule Take 300 mg by mouth at bedtime.      Marland Kitchen glipiZIDE (GLUCOTROL XL) 5 MG 24 hr tablet Take 5 mg by mouth at bedtime.      Marland Kitchen lisinopril  (PRINIVIL,ZESTRIL) 40 MG tablet Take 40 mg by mouth daily.      . metFORMIN (GLUCOPHAGE) 500 MG tablet Take 500 mg by mouth 2 (two) times daily with a meal.      . simvastatin (ZOCOR) 40 MG tablet Take 40 mg by mouth every evening.      Marland Kitchen doxycycline (VIBRAMYCIN) 100 MG capsule Take 100 mg by mouth 2 (two) times daily.      . hydrochlorothiazide (MICROZIDE) 12.5 MG capsule Take 12.5 mg by mouth daily.       Current Facility-Administered Medications on File Prior to Visit  Medication Dose Route Frequency Provider Last Rate Last Dose  . iohexol (OMNIPAQUE) 350 MG/ML injection 100 mL  100 mL Intravenous Once PRN Medication Radiologist, MD   100 mL at 10/16/11 1238     REVIEW OF SYSTEMS: All systems are negative as documented by the patient in the encounter form  PHYSICAL EXAMINATION:   Vital signs are BP 146/80  Pulse 78  Resp 16  Ht 6' (1.829 m)  Wt 274 lb 1.6 oz (124.331 kg)  BMI 37.17 kg/m2  SpO2 98% General: The  patient appears their stated age. HEENT:  No gross abnormalities Pulmonary:  Non labored breathing Abdomen: Soft and non-tender Musculoskeletal: There are no major deformities. Neurologic: No focal weakness or paresthesias are detected, Skin: There are no ulcer or rashes noted. Psychiatric: The patient has normal affect. Cardiovascular: There is a regular rate and rhythm without significant murmur appreciated.   Diagnostic Studies I have reviewed his CT angiogram performed today. Radiologic interpretation has not been completed. I see no complications with the stent graft. His aneurysm size has decreased. There is no evidence of endoleak  Assessment: Status post endovascular aneurysm repair Plan: The patient was placed on surveillance protocol. He will come back in one year with an abdominal ultrasound. I reviewed his carotid duplex done at his preoperative visit. He has minimal carotid disease bilaterally and therefore I will not plan on repeating this.  Jorge Ny, M.D. [facility] [place] Office: [phone] Pager:  [phone]

## 2012-10-16 ENCOUNTER — Other Ambulatory Visit: Payer: BC Managed Care – PPO

## 2012-10-16 ENCOUNTER — Ambulatory Visit: Payer: BC Managed Care – PPO | Admitting: Neurosurgery

## 2015-09-15 ENCOUNTER — Encounter: Payer: Self-pay | Admitting: Cardiology

## 2015-09-15 ENCOUNTER — Ambulatory Visit (INDEPENDENT_AMBULATORY_CARE_PROVIDER_SITE_OTHER): Payer: BLUE CROSS/BLUE SHIELD | Admitting: Cardiology

## 2015-09-15 ENCOUNTER — Encounter: Payer: Self-pay | Admitting: *Deleted

## 2015-09-15 VITALS — BP 126/76 | HR 73 | Ht 72.0 in | Wt 269.4 lb

## 2015-09-15 DIAGNOSIS — R079 Chest pain, unspecified: Secondary | ICD-10-CM | POA: Diagnosis not present

## 2015-09-15 NOTE — Patient Instructions (Signed)
Your physician recommends that you schedule a follow-up appointment TO BE DETERMINED AFTER TESTING   Your physician has recommended you make the following change in your medication:   START ASPRIN 81 MG DAILY  Your physician has requested that you have en exercise stress myoview. For further information please visit [email or web address]. Please follow instruction sheet, as given.   Thank you for choosing [facility]!!

## 2015-09-15 NOTE — Progress Notes (Signed)
Patient ID: Berline ChoughWendell L Adinolfi, male   DOB: 09/22/1955, 60 y.o.   MRN: [id number]     Clinical Summary Mr. Chelsea PrimusMinter is a 60 y.o.male seen as a new patient, he is referred by Dr Argie RammingBurdnine.  1. Chest pain - tightness started a few weeks ago. Left chest, 2/10. Tends to occur with activity, mainly occurs with carrying boxes at home. No other associated symptoms. Lasted few minutes, resolved with rest. No recurrent episodes - has had some general fatigue, he attributes to higher dose of victoza. No SOB or DOE.   CAD risk factors: DM2, HTN, HL, +tobacco x 45 years, paternal grandfaterh MI 4252, two paternal unclears early 8350s MI, maternal uncle age 59 MI   2. AAA - followed by vascular  3. Palpitations - can feel occasinonally, just a few seconds. Variable in frequency - coffee x 3 cups, occasional sweet tea, 1-2 cokes daily, no regular EtoH   SH: wife is LPN. 2 daughters Past Medical History  Diagnosis Date  . Diabetes mellitus   . Hypertension   . Hyperlipidemia   . Pancreatitis      No Known Allergies   Current Outpatient Prescriptions  Medication Sig Dispense Refill  . doxycycline (VIBRAMYCIN) 100 MG capsule Take 100 mg by mouth 2 (two) times daily.    . fish oil-omega-3 fatty acids 1000 MG capsule Take 2 g by mouth daily.    Marland Kitchen. gabapentin (NEURONTIN) 300 MG capsule Take 300 mg by mouth at bedtime.    Marland Kitchen. glipiZIDE (GLUCOTROL XL) 5 MG 24 hr tablet Take 5 mg by mouth at bedtime.    . hydrochlorothiazide (MICROZIDE) 12.5 MG capsule Take 12.5 mg by mouth daily.    Marland Kitchen. lisinopril (PRINIVIL,ZESTRIL) 40 MG tablet Take 40 mg by mouth daily.    . metFORMIN (GLUCOPHAGE) 500 MG tablet Take 500 mg by mouth 2 (two) times daily with a meal.    . simvastatin (ZOCOR) 40 MG tablet Take 40 mg by mouth every evening.     No current facility-administered medications for this visit.     Past Surgical History  Procedure Laterality Date  . Abdominal aortic aneurysm repair  02-04-2009    EVAR  .  Cholecystectomy  Sept. 2008     No Known Allergies    Family History  Problem Relation Age of Onset  . Heart disease Mother   . Diabetes Father   . Cancer Sister      Social History Mr. Chelsea PrimusMinter reports that he has been smoking Cigars.  He does not have any smokeless tobacco history on file. Mr. Chelsea PrimusMinter reports that he drinks about 0.6 oz of alcohol per week.   Review of Systems CONSTITUTIONAL: No weight loss, fever, chills, weakness or fatigue.  HEENT: Eyes: No visual loss, blurred vision, double vision or yellow sclerae.No hearing loss, sneezing, congestion, runny nose or sore throat.  SKIN: No rash or itching.  CARDIOVASCULAR: per HPI RESPIRATORY: No shortness of breath, cough or sputum.  GASTROINTESTINAL: No anorexia, nausea, vomiting or diarrhea. No abdominal pain or blood.  GENITOURINARY: No burning on urination, no polyuria NEUROLOGICAL: No headache, dizziness, syncope, paralysis, ataxia, numbness or tingling in the extremities. No change in bowel or bladder control.  MUSCULOSKELETAL: No muscle, back pain, joint pain or stiffness.  LYMPHATICS: No enlarged nodes. No history of splenectomy.  PSYCHIATRIC: No history of depression or anxiety.  ENDOCRINOLOGIC: No reports of sweating, cold or heat intolerance. No polyuria or polydipsia.  Marland Kitchen.   Physical Examination Filed Vitals:  09/15/15 1412  BP: 126/76  Pulse: 73   Filed Vitals:   09/15/15 1412  Height: 6' (1.829 m)  Weight: 269 lb 6.4 oz (122.199 kg)    Gen: resting comfortably, no acute distress HEENT: no scleral icterus, pupils equal round and reactive, no palptable cervical adenopathy,  CV: RRR, no m/r/g, no jvd Resp: Clear to auscultation bilaterally GI: abdomen is soft, non-tender, non-distended, normal bowel sounds, no hepatosplenomegaly MSK: extremities are warm, no edema.  Skin: warm, no rash Neuro:  no focal deficits Psych: appropriate affect    Assessment and Plan   1. Chest pain - unclear  etiology, he has multiple CAD risk factors - EKG shows SR with nonspecific ischemic changes - will order exercise nuclear stress 2 day protocol  2. Palpitations - fairly mild symptoms, he will work to cut back on caffeine at this time and we will monitor symptoms.   F/u pending stress results  Antoine Poche, M.D.

## 2015-09-23 ENCOUNTER — Encounter (HOSPITAL_COMMUNITY)
Admission: RE | Admit: 2015-09-23 | Discharge: 2015-09-23 | Disposition: A | Discharge status: Home / Self Care | Payer: BLUE CROSS/BLUE SHIELD | Source: Ambulatory Visit | Attending: Cardiology | Admitting: Cardiology

## 2015-09-23 ENCOUNTER — Encounter (HOSPITAL_BASED_OUTPATIENT_CLINIC_OR_DEPARTMENT_OTHER)
Admission: RE | Admit: 2015-09-23 | Discharge: 2015-09-23 | Disposition: A | Discharge status: Home / Self Care | Payer: BLUE CROSS/BLUE SHIELD | Source: Ambulatory Visit | Attending: Cardiology | Admitting: Cardiology

## 2015-09-23 DIAGNOSIS — R079 Chest pain, unspecified: Secondary | ICD-10-CM

## 2015-09-23 MED ORDER — REGADENOSON 0.4 MG/5ML IV SOLN
INTRAVENOUS | Status: AC
Start: 2015-09-23 — End: 2015-09-23
  Administered 2015-09-23: 0.4 mg via INTRAVENOUS
  Filled 2015-09-23: qty 5

## 2015-09-23 MED ORDER — TECHNETIUM TC 99M TETROFOSMIN IV KIT
30.0000 | PACK | Freq: Once | INTRAVENOUS | Status: AC | PRN
  Administered 2015-09-23: 30 via INTRAVENOUS

## 2015-09-23 MED ORDER — REGADENOSON 0.4 MG/5ML IV SOLN
INTRAVENOUS | Status: AC
  Filled 2015-09-23: qty 5

## 2015-09-23 MED ORDER — SODIUM CHLORIDE 0.9% FLUSH
INTRAVENOUS | Status: AC
  Filled 2015-09-23: qty 10

## 2015-09-23 MED ORDER — SODIUM CHLORIDE 0.9% FLUSH
INTRAVENOUS | Status: AC
  Administered 2015-09-23: 10 mL via INTRAVENOUS
  Filled 2015-09-23: qty 10

## 2015-09-23 MED ORDER — REGADENOSON 0.4 MG/5ML IV SOLN
INTRAVENOUS | Status: AC
Start: 2015-09-23 — End: 2015-09-23
  Filled 2015-09-23: qty 5

## 2015-09-24 ENCOUNTER — Encounter (HOSPITAL_COMMUNITY)
Admission: RE | Admit: 2015-09-24 | Discharge: 2015-09-24 | Disposition: A | Discharge status: Home / Self Care | Payer: BLUE CROSS/BLUE SHIELD | Source: Ambulatory Visit | Attending: Cardiology | Admitting: Cardiology

## 2015-09-24 ENCOUNTER — Encounter (HOSPITAL_COMMUNITY): Payer: Self-pay

## 2015-09-24 DIAGNOSIS — R079 Chest pain, unspecified: Secondary | ICD-10-CM | POA: Diagnosis not present

## 2015-09-24 LAB — NM MYOCAR MULTI W/SPECT W/WALL MOTION / EF
CHL CUP RESTING HR STRESS: 84 {beats}/min
CHL RATE OF PERCEIVED EXERTION: 17
CSEPED: 1 min
CSEPEDS: 48 s
CSEPEW: 4.6 METS
CSEPPHR: 117 {beats}/min
LV dias vol: 114 mL (ref 62–150)
LVSYSVOL: 48 mL
MPHR: 161 {beats}/min
Percent HR: 72 %
RATE: 0.38
SDS: 1
SRS: 3
SSS: 4
TID: 0.8

## 2015-09-24 MED ORDER — TECHNETIUM TC 99M TETROFOSMIN IV KIT
25.0000 | PACK | Freq: Once | INTRAVENOUS | Status: AC | PRN
  Administered 2015-09-24: 25 via INTRAVENOUS

## 2015-09-27 ENCOUNTER — Telehealth: Payer: Self-pay | Admitting: *Deleted

## 2015-09-27 NOTE — Telephone Encounter (Signed)
-----   Message from Antoine PocheJonathan F Branch, MD sent at 09/24/2015  1:26 PM EDT ----- Stress test looks good, no evidence of any significant blockages. He needs to f/u with his pcp to discuss noncardiac causes of chest pain. I'd like to see him back in 3-4 weeks to reassess symptoms and discuss test results in detail and answer any questions.   J BrancH MD

## 2015-09-27 NOTE — Telephone Encounter (Signed)
Pt aware, scheduled f/u appt - routed to pcp

## 2015-10-07 ENCOUNTER — Encounter: Payer: Self-pay | Admitting: Cardiology

## 2015-10-07 ENCOUNTER — Ambulatory Visit (INDEPENDENT_AMBULATORY_CARE_PROVIDER_SITE_OTHER): Payer: BLUE CROSS/BLUE SHIELD | Admitting: Cardiology

## 2015-10-07 VITALS — BP 159/76 | HR 80 | Ht 72.0 in | Wt 273.0 lb

## 2015-10-07 DIAGNOSIS — R002 Palpitations: Secondary | ICD-10-CM | POA: Diagnosis not present

## 2015-10-07 DIAGNOSIS — R079 Chest pain, unspecified: Secondary | ICD-10-CM | POA: Diagnosis not present

## 2015-10-07 DIAGNOSIS — I10 Essential (primary) hypertension: Secondary | ICD-10-CM | POA: Diagnosis not present

## 2015-10-07 NOTE — Patient Instructions (Signed)
Your physician wants you to follow-up in: 1 year with Dr. Lurena JoinerBranch You will receive a reminder letter in the mail two months in advance. If you don't receive a letter, please call our office to schedule the follow-up appointment.  Your physician recommends that you continue on your current medications as directed. Please refer to the Current Medication list given to you today.  Your physician has requested that you regularly monitor and record your blood pressure readings at home FOR 2 WEEKS AND CALL IN OR BRING IN RESULTS. Please use the same machine at the same time of day to check your readings and record them to bring to your follow-up visit.  Thank you for choosing [facility]!!

## 2015-10-07 NOTE — Progress Notes (Signed)
Clinical Summary [name] is a 60 y.o.male seen today for follow up of the following medical problems.   1. Chest pain - last visit complained of some chest pain - since last visit he completed a Lexiscan that was negative for ischemia.  - since last visit only occasional muscle aches in chest.   2. AAA - followed by vascular  3. Palpitations - has cut back on caffeine. Palpitations have improved.      SH: wife is LPN. 2 daughters   Past Medical History  Diagnosis Date  . Diabetes mellitus   . Hypertension   . Hyperlipidemia   . Pancreatitis      Allergies  Allergen Reactions  . Sulfa Antibiotics Nausea Only     Current Outpatient Prescriptions  Medication Sig Dispense Refill  . aspirin 81 MG tablet Take 81 mg by mouth daily.    Marland Kitchen. gabapentin (NEURONTIN) 600 MG tablet Take 1 tablet by mouth at bedtime.    Marland Kitchen. glipiZIDE (GLUCOTROL XL) 5 MG 24 hr tablet Take 5 mg by mouth at bedtime.    . Liraglutide (VICTOZA) 18 MG/3ML SOPN Inject 0.6 mg into the skin at bedtime.     Marland Kitchen. lisinopril (PRINIVIL,ZESTRIL) 40 MG tablet Take 40 mg by mouth daily.    . metFORMIN (GLUCOPHAGE) 500 MG tablet Take 4 tabs by mouth daily at noon    . simvastatin (ZOCOR) 40 MG tablet Take 40 mg by mouth every evening.     No current facility-administered medications for this visit.     Past Surgical History  Procedure Laterality Date  . Abdominal aortic aneurysm repair  02-04-2009    EVAR  . Cholecystectomy  Sept. 2008     Allergies  Allergen Reactions  . Sulfa Antibiotics Nausea Only      Family History  Problem Relation Age of Onset  . Heart disease Mother   . Diabetes Father   . Cancer Sister      Social History [name] reports that he has been smoking Cigars.  He does not have any smokeless tobacco history on file. [name] reports that he drinks about 0.6 oz of alcohol per week.   Review of Systems CONSTITUTIONAL: No weight loss, fever, chills, weakness or  fatigue.  HEENT: Eyes: No visual loss, blurred vision, double vision or yellow sclerae.No hearing loss, sneezing, congestion, runny nose or sore throat.  SKIN: No rash or itching.  CARDIOVASCULAR: per HPI RESPIRATORY: No shortness of breath, cough or sputum.  GASTROINTESTINAL: No anorexia, nausea, vomiting or diarrhea. No abdominal pain or blood.  GENITOURINARY: No burning on urination, no polyuria NEUROLOGICAL: No headache, dizziness, syncope, paralysis, ataxia, numbness or tingling in the extremities. No change in bowel or bladder control.  MUSCULOSKELETAL: No muscle, back pain, joint pain or stiffness.  LYMPHATICS: No enlarged nodes. No history of splenectomy.  PSYCHIATRIC: No history of depression or anxiety.  ENDOCRINOLOGIC: No reports of sweating, cold or heat intolerance. No polyuria or polydipsia.  Marland Kitchen.   Physical Examination Filed Vitals:   10/07/15 0823  BP: 159/76  Pulse: 80   Filed Vitals:   10/07/15 0823  Height: 6' (1.829 m)  Weight: 273 lb (123.832 kg)    Gen: resting comfortably, no acute distress HEENT: no scleral icterus, pupils equal round and reactive, no palptable cervical adenopathy,  CV: RRR, no m/r/g, no jvd Resp: Clear to auscultation bilaterally GI: abdomen is soft, non-tender, non-distended, normal bowel sounds, no hepatosplenomegaly MSK: extremities are warm, no edema.  Skin: warm, no rash Neuro:  no focal deficits Psych: appropriate affect   Diagnostic Studies 09/2015 Exercise nuclear stress test  There was no ST segment deviation noted during stress.  The study is normal. Fixed inferior wall defect most consistent with subdiaphragmatic attenuation. No significant evidence of prior infarct or current ischemia.  This is a low risk study.  The left ventricular ejection fraction is normal (55-65%).     Assessment and Plan   1. Chest pain - recent atypical symptoms - stress test negative for ischemia - continue to monitor, continue risk  factor modificaiton.   2. Palpitations - symptoms improved with decreased caffeine, we will continue to monitor   3. HTN - elevated in clinic, he will present a bp log in 2 weeks, medicaiton adjustment based on results.   F/u 6 months     Antoine Poche, M.D.

## 2015-11-08 ENCOUNTER — Telehealth: Payer: Self-pay | Admitting: Cardiology

## 2015-11-08 NOTE — Telephone Encounter (Signed)
Returned call  - stated he is going into work just leave message and he will call back

## 2015-11-08 NOTE — Telephone Encounter (Signed)
BP log reviewed, overall looks good. No changes. Please scan this message into chart.   Dominga FerryJ Branch MD    LM on VM with normal results.

## 2017-05-09 IMAGING — NM NM MYOCAR MULTI W/SPECT W/WALL MOTION & EF
2 series · 12 of 12 positions shown · non-contrast
Comparison: none

[Series 1: stress gated · 8.28mm/px · 6 of 64 frames shown]
[frame 6/64]
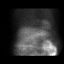
[frame 16/64]
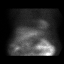
[frame 27/64]
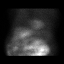
[frame 38/64]
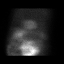
[frame 48/64]
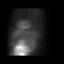
[frame 59/64]
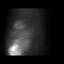

[Series 2: rest · 8.28mm/px · 6 of 64 frames shown]
[frame 6/64]
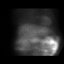
[frame 16/64]
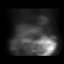
[frame 27/64]
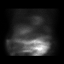
[frame 38/64]
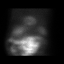
[frame 48/64]
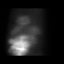
[frame 59/64]
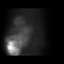

[12 of 12 positions shown; findings below may reference images not displayed]

Canned report from images found in remote index.

Refer to host system for actual result text.

## 2018-06-07 ENCOUNTER — Telehealth: Payer: Self-pay | Admitting: Cardiology

## 2018-06-07 NOTE — Telephone Encounter (Signed)
Numerous attempts to contact patient with recall letters. Unable to reach by telephone. with no success.    Time: Status:     10/07/2015 8:56 AM New [10]    06/22/2016 11:02 PM Notification Sent [20]    05/14/2017 8:53 AM Notification Sent [20]    11/07/2017 4:30 PM Notification Sent [20]    06/07/2018 10:10 AM Notification Sent [20]

## 2019-03-24 ENCOUNTER — Other Ambulatory Visit (HOSPITAL_COMMUNITY)
Admission: RE | Admit: 2019-03-24 | Discharge: 2019-03-24 | Disposition: A | Discharge status: Home / Self Care | Payer: BC Managed Care – PPO | Source: Ambulatory Visit | Attending: Surgery | Admitting: Surgery

## 2019-03-24 ENCOUNTER — Other Ambulatory Visit: Payer: Self-pay

## 2019-03-24 ENCOUNTER — Encounter: Payer: Self-pay | Admitting: *Deleted

## 2019-03-24 ENCOUNTER — Ambulatory Visit (INDEPENDENT_AMBULATORY_CARE_PROVIDER_SITE_OTHER): Payer: BC Managed Care – PPO | Admitting: Surgery

## 2019-03-24 ENCOUNTER — Encounter: Payer: Self-pay | Admitting: Surgery

## 2019-03-24 VITALS — BP 146/86 | HR 97 | Temp 98.1°F | Resp 20 | Ht 72.0 in | Wt 264.0 lb

## 2019-03-24 DIAGNOSIS — Z01812 Encounter for preprocedural laboratory examination: Secondary | ICD-10-CM | POA: Diagnosis not present

## 2019-03-24 DIAGNOSIS — I714 Abdominal aortic aneurysm, without rupture, unspecified: Secondary | ICD-10-CM

## 2019-03-24 DIAGNOSIS — Z20828 Contact with and (suspected) exposure to other viral communicable diseases: Secondary | ICD-10-CM | POA: Diagnosis not present

## 2019-03-24 DIAGNOSIS — I70229 Atherosclerosis of native arteries of extremities with rest pain, unspecified extremity: Secondary | ICD-10-CM | POA: Diagnosis not present

## 2019-03-24 LAB — SARS CORONAVIRUS 2 (TAT 6-24 HRS): SARS Coronavirus 2: NEGATIVE

## 2019-03-24 NOTE — Progress Notes (Signed)
 [facility] [place]  Patient name: George Webb MRN: [id number] DOB: 06/14/1955 Sex: male   REQUESTING PROVIDER:    Amy Boyd   REASON FOR CONSULT:    claudication  HISTORY OF PRESENT ILLNESS:   George Webb is a 63 y.o. male, who I patient had taken care of in the past but have not seen since 2013.  I performed endovascular repair of a 5.5 cm abdominal aortic aneurysm in October 2010.  He had a CT scan at that time that showed good position of his stent graft without evidence of endoleak and decrease in size of his aneurysm.  He now comes in today for a 5-week history of painful blue toes on the left.  He denies any history of trauma.  There are no open wounds.  These do wake him up at night.  The patient does suffer from diabetes.  He is medically managed for hypertension with an ARB.  He takes a statin for hypercholesterolemia.  He is a current smoker.  PAST MEDICAL HISTORY    Past Medical History:  Diagnosis Date  . Diabetes mellitus   . Hyperlipidemia   . Hypertension   . Pancreatitis      FAMILY HISTORY   Family History  Problem Relation Age of Onset  . Heart disease Mother   . Diabetes Father   . Cancer Sister     SOCIAL HISTORY:   Social History   Socioeconomic History  . Marital status: Married    Spouse name: Not on file  . Number of children: Not on file  . Years of education: Not on file  . Highest education level: Not on file  Occupational History  . Not on file  Social Needs  . Financial resource strain: Not on file  . Food insecurity    Worry: Not on file    Inability: Not on file  . Transportation needs    Medical: Not on file    Non-medical: Not on file  Tobacco Use  . Smoking status: Current Every Day Smoker    Packs/day: 1.00    Years: 20.00    Pack years: 20.00    Types: Cigars  . Smokeless tobacco: Never Used  . Tobacco comment: avg 4-5 per day  Substance and  Sexual Activity  . Alcohol use: Yes    Alcohol/week: 1.0 standard drinks    Types: 1 Glasses of wine per week  . Drug use: No  . Sexual activity: Not on file  Lifestyle  . Physical activity    Days per week: Not on file    Minutes per session: Not on file  . Stress: Not on file  Relationships  . Social connections    Talks on phone: Not on file    Gets together: Not on file    Attends religious service: Not on file    Active member of club or organization: Not on file    Attends meetings of clubs or organizations: Not on file    Relationship status: Not on file  . Intimate partner violence    Fear of current or ex partner: Not on file    Emotionally abused: Not on file    Physically abused: Not on file    Forced sexual activity: Not on file  Other Topics Concern  . Not on file  Social History Narrative  . Not on file    ALLERGIES:    Allergies  Allergen Reactions  . Sulfa   Antibiotics Nausea Only    CURRENT MEDICATIONS:    Current Outpatient Medications  Medication Sig Dispense Refill  . aspirin 81 MG tablet Take 81 mg by mouth daily.    Marland Kitchen atorvastatin (LIPITOR) 40 MG tablet Take 1 tablet by mouth daily.    . Fish Oil-Cholecalciferol (FISH OIL + D3) 1000-1000 MG-UNIT CAPS Take 1 capsule by mouth daily.    Marland Kitchen gabapentin (NEURONTIN) 600 MG tablet Take 1 tablet by mouth at bedtime.    Marland Kitchen GLIPIZIDE XL 10 MG 24 hr tablet Take 1 tablet by mouth daily.    Marland Kitchen losartan (COZAAR) 50 MG tablet Take 50 mg by mouth daily.    . metFORMIN (GLUCOPHAGE-XR) 500 MG 24 hr tablet Take 4 tablets by mouth daily.     No current facility-administered medications for this visit.     REVIEW OF SYSTEMS:   [X]  denotes positive finding, [ ]  denotes negative finding Cardiac  Comments:  Chest pain or chest pressure:    Shortness of breath upon exertion:    Short of breath when lying flat:    Irregular heart rhythm:        Vascular    Pain in calf, thigh, or hip brought on by ambulation:     Pain in feet at night that wakes you up from your sleep:  x   Blood clot in your veins:    Leg swelling:         Pulmonary    Oxygen at home:    Productive cough:     Wheezing:         Neurologic    Sudden weakness in arms or legs:     Sudden numbness in arms or legs:     Sudden onset of difficulty speaking or slurred speech:    Temporary loss of vision in one eye:     Problems with dizziness:         Gastrointestinal    Blood in stool:      Vomited blood:         Genitourinary    Burning when urinating:     Blood in urine:        Psychiatric    Major depression:         Hematologic    Bleeding problems:    Problems with blood clotting too easily:        Skin    Rashes or ulcers:        Constitutional    Fever or chills:     PHYSICAL EXAM:   There were no vitals filed for this visit.  GENERAL: The patient is a well-nourished male, in no acute distress. The vital signs are documented above. CARDIAC: There is a regular rate and rhythm.  VASCULAR: Palpable bilateral femoral pulses.  Bilateral popliteal and pedal pulses are not palpable PULMONARY: Nonlabored respirations ABDOMEN: Soft and non-tender with normal pitched bowel sounds.  MUSCULOSKELETAL: There are no major deformities or cyanosis. NEUROLOGIC: No focal weakness or paresthesias are detected. SKIN: There are no ulcers or rashes noted. PSYCHIATRIC: The patient has a normal affect.  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  I think he needs to have angiography to define his anatomy and determine what his options are for revascularization.  I am scheduling this for tomorrow.  This will be through a right femoral approach.  He may possibly need antegrade approach to the left leg if he has options for revascularization given his history of  aneurysm repair.  Continue statin therapy.  Continue current diabetes and hypertensive medication management.  He is taking an aspirin.   Charlena Cross, MD, FACS [facility] [phone] Pager [phone]

## 2019-03-24 NOTE — H&P (View-Only) (Signed)
[facility] [place]  Patient name: George ChoughWendell L Minichiello MRN: [id number] DOB: 12/10/1955 Sex: male   REQUESTING PROVIDER:    Roxine CaddyAmy Boyd   REASON FOR CONSULT:    claudication  HISTORY OF PRESENT ILLNESS:   George Webb is a 63 y.o. male, who I patient had taken care of in the past but have not seen since 2013.  I performed endovascular repair of a 5.5 cm abdominal aortic aneurysm in October 2010.  He had a CT scan at that time that showed good position of his stent graft without evidence of endoleak and decrease in size of his aneurysm.  He now comes in today for a 5-week history of painful blue toes on the left.  He denies any history of trauma.  There are no open wounds.  These do wake him up at night.  The patient does suffer from diabetes.  He is medically managed for hypertension with an ARB.  He takes a statin for hypercholesterolemia.  He is a current smoker.  PAST MEDICAL HISTORY    Past Medical History:  Diagnosis Date  . Diabetes mellitus   . Hyperlipidemia   . Hypertension   . Pancreatitis      FAMILY HISTORY   Family History  Problem Relation Age of Onset  . Heart disease Mother   . Diabetes Father   . Cancer Sister     SOCIAL HISTORY:   Social History   Socioeconomic History  . Marital status: Married    Spouse name: Not on file  . Number of children: Not on file  . Years of education: Not on file  . Highest education level: Not on file  Occupational History  . Not on file  Social Needs  . Financial resource strain: Not on file  . Food insecurity    Worry: Not on file    Inability: Not on file  . Transportation needs    Medical: Not on file    Non-medical: Not on file  Tobacco Use  . Smoking status: Current Every Day Smoker    Packs/day: 1.00    Years: 20.00    Pack years: 20.00    Types: Cigars  . Smokeless tobacco: Never Used  . Tobacco comment: avg 4-5 per day  Substance and  Sexual Activity  . Alcohol use: Yes    Alcohol/week: 1.0 standard drinks    Types: 1 Glasses of wine per week  . Drug use: No  . Sexual activity: Not on file  Lifestyle  . Physical activity    Days per week: Not on file    Minutes per session: Not on file  . Stress: Not on file  Relationships  . Social Musicianconnections    Talks on phone: Not on file    Gets together: Not on file    Attends religious service: Not on file    Active member of club or organization: Not on file    Attends meetings of clubs or organizations: Not on file    Relationship status: Not on file  . Intimate partner violence    Fear of current or ex partner: Not on file    Emotionally abused: Not on file    Physically abused: Not on file    Forced sexual activity: Not on file  Other Topics Concern  . Not on file  Social History Narrative  . Not on file    ALLERGIES:    Allergies  Allergen Reactions  . Sulfa  Antibiotics Nausea Only    CURRENT MEDICATIONS:    Current Outpatient Medications  Medication Sig Dispense Refill  . aspirin 81 MG tablet Take 81 mg by mouth daily.    Marland Kitchen atorvastatin (LIPITOR) 40 MG tablet Take 1 tablet by mouth daily.    . Fish Oil-Cholecalciferol (FISH OIL + D3) 1000-1000 MG-UNIT CAPS Take 1 capsule by mouth daily.    Marland Kitchen gabapentin (NEURONTIN) 600 MG tablet Take 1 tablet by mouth at bedtime.    Marland Kitchen GLIPIZIDE XL 10 MG 24 hr tablet Take 1 tablet by mouth daily.    Marland Kitchen losartan (COZAAR) 50 MG tablet Take 50 mg by mouth daily.    . metFORMIN (GLUCOPHAGE-XR) 500 MG 24 hr tablet Take 4 tablets by mouth daily.     No current facility-administered medications for this visit.     REVIEW OF SYSTEMS:   [X]  denotes positive finding, [ ]  denotes negative finding Cardiac  Comments:  Chest pain or chest pressure:    Shortness of breath upon exertion:    Short of breath when lying flat:    Irregular heart rhythm:        Vascular    Pain in calf, thigh, or hip brought on by ambulation:     Pain in feet at night that wakes you up from your sleep:  x   Blood clot in your veins:    Leg swelling:         Pulmonary    Oxygen at home:    Productive cough:     Wheezing:         Neurologic    Sudden weakness in arms or legs:     Sudden numbness in arms or legs:     Sudden onset of difficulty speaking or slurred speech:    Temporary loss of vision in one eye:     Problems with dizziness:         Gastrointestinal    Blood in stool:      Vomited blood:         Genitourinary    Burning when urinating:     Blood in urine:        Psychiatric    Major depression:         Hematologic    Bleeding problems:    Problems with blood clotting too easily:        Skin    Rashes or ulcers:        Constitutional    Fever or chills:     PHYSICAL EXAM:   There were no vitals filed for this visit.  GENERAL: The patient is a well-nourished male, in no acute distress. The vital signs are documented above. CARDIAC: There is a regular rate and rhythm.  VASCULAR: Palpable bilateral femoral pulses.  Bilateral popliteal and pedal pulses are not palpable PULMONARY: Nonlabored respirations ABDOMEN: Soft and non-tender with normal pitched bowel sounds.  MUSCULOSKELETAL: There are no major deformities or cyanosis. NEUROLOGIC: No focal weakness or paresthesias are detected. SKIN: There are no ulcers or rashes noted. PSYCHIATRIC: The patient has a normal affect.  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  I think he needs to have angiography to define his anatomy and determine what his options are for revascularization.  I am scheduling this for tomorrow.  This will be through a right femoral approach.  He may possibly need antegrade approach to the left leg if he has options for revascularization given his history of  aneurysm repair.  Continue statin therapy.  Continue current diabetes and hypertensive medication management.  He is taking an aspirin.   Charlena Cross, MD, FACS [facility] [phone] Pager [phone]

## 2019-03-25 ENCOUNTER — Other Ambulatory Visit: Payer: Self-pay

## 2019-03-25 ENCOUNTER — Ambulatory Visit (HOSPITAL_COMMUNITY)
Admission: RE | Admit: 2019-03-25 | Discharge: 2019-03-25 | Disposition: A | Discharge status: Home / Self Care | Payer: BC Managed Care – PPO | Attending: Surgery | Admitting: Surgery

## 2019-03-25 ENCOUNTER — Ambulatory Visit (HOSPITAL_COMMUNITY)
Admission: RE | Disposition: A | Discharge status: Home / Self Care | Payer: Self-pay | Source: Home / Self Care | Attending: Surgery

## 2019-03-25 DIAGNOSIS — I714 Abdominal aortic aneurysm, without rupture: Secondary | ICD-10-CM | POA: Diagnosis not present

## 2019-03-25 DIAGNOSIS — Z79899 Other long term (current) drug therapy: Secondary | ICD-10-CM | POA: Diagnosis not present

## 2019-03-25 DIAGNOSIS — Z7982 Long term (current) use of aspirin: Secondary | ICD-10-CM | POA: Diagnosis not present

## 2019-03-25 DIAGNOSIS — Z7984 Long term (current) use of oral hypoglycemic drugs: Secondary | ICD-10-CM | POA: Diagnosis not present

## 2019-03-25 DIAGNOSIS — I70229 Atherosclerosis of native arteries of extremities with rest pain, unspecified extremity: Secondary | ICD-10-CM

## 2019-03-25 DIAGNOSIS — I10 Essential (primary) hypertension: Secondary | ICD-10-CM | POA: Diagnosis not present

## 2019-03-25 DIAGNOSIS — E7800 Pure hypercholesterolemia, unspecified: Secondary | ICD-10-CM | POA: Diagnosis not present

## 2019-03-25 DIAGNOSIS — F17210 Nicotine dependence, cigarettes, uncomplicated: Secondary | ICD-10-CM | POA: Insufficient documentation

## 2019-03-25 DIAGNOSIS — I70212 Atherosclerosis of native arteries of extremities with intermittent claudication, left leg: Secondary | ICD-10-CM | POA: Diagnosis not present

## 2019-03-25 DIAGNOSIS — Z8249 Family history of ischemic heart disease and other diseases of the circulatory system: Secondary | ICD-10-CM | POA: Diagnosis not present

## 2019-03-25 DIAGNOSIS — Z882 Allergy status to sulfonamides status: Secondary | ICD-10-CM | POA: Diagnosis not present

## 2019-03-25 DIAGNOSIS — E1151 Type 2 diabetes mellitus with diabetic peripheral angiopathy without gangrene: Secondary | ICD-10-CM | POA: Diagnosis not present

## 2019-03-25 DIAGNOSIS — E785 Hyperlipidemia, unspecified: Secondary | ICD-10-CM | POA: Insufficient documentation

## 2019-03-25 HISTORY — PX: ABDOMINAL AORTOGRAM W/LOWER EXTREMITY: CATH118223

## 2019-03-25 HISTORY — PX: PERIPHERAL VASCULAR INTERVENTION: CATH118257

## 2019-03-25 HISTORY — PX: PERIPHERAL VASCULAR ATHERECTOMY: CATH118256

## 2019-03-25 LAB — POCT I-STAT, CHEM 8
BUN: 22 mg/dL (ref 8–23)
Calcium, Ion: 1.17 mmol/L (ref 1.15–1.40)
Chloride: 104 mmol/L (ref 98–111)
Creatinine, Ser: 0.9 mg/dL (ref 0.61–1.24)
Glucose, Bld: 224 mg/dL — ABNORMAL HIGH (ref 70–99)
HCT: 51 % (ref 39.0–52.0)
Hemoglobin: 17.3 g/dL — ABNORMAL HIGH (ref 13.0–17.0)
Potassium: 5.6 mmol/L — ABNORMAL HIGH (ref 3.5–5.1)
Sodium: 135 mmol/L (ref 135–145)
TCO2: 24 mmol/L (ref 22–32)

## 2019-03-25 LAB — GLUCOSE, CAPILLARY: Glucose-Capillary: 191 mg/dL — ABNORMAL HIGH (ref 70–99)

## 2019-03-25 LAB — POCT ACTIVATED CLOTTING TIME
Activated Clotting Time: 230 seconds
Activated Clotting Time: 246 seconds

## 2019-03-25 SURGERY — ABDOMINAL AORTOGRAM W/LOWER EXTREMITY
Anesthesia: LOCAL

## 2019-03-25 MED ORDER — OXYCODONE HCL 5 MG PO TABS: 5.0000 mg | ORAL_TABLET | ORAL | Status: DC | PRN

## 2019-03-25 MED ORDER — SODIUM CHLORIDE 0.9 % IV SOLN
INTRAVENOUS | Status: DC
  Administered 2019-03-25: 11:00:00 via INTRAVENOUS

## 2019-03-25 MED ORDER — HEPARIN SODIUM (PORCINE) 1000 UNIT/ML IJ SOLN
INTRAMUSCULAR | Status: DC | PRN
  Administered 2019-03-25: 10000 [IU] via INTRAVENOUS
  Administered 2019-03-25: 2000 [IU] via INTRAVENOUS

## 2019-03-25 MED ORDER — HEPARIN (PORCINE) IN NACL 1000-0.9 UT/500ML-% IV SOLN
INTRAVENOUS | Status: DC | PRN
  Administered 2019-03-25 (×2): 500 mL

## 2019-03-25 MED ORDER — LABETALOL HCL 5 MG/ML IV SOLN: 10.0000 mg | INTRAVENOUS | Status: DC | PRN

## 2019-03-25 MED ORDER — LIDOCAINE HCL (PF) 1 % IJ SOLN
INTRAMUSCULAR | Status: AC
  Filled 2019-03-25: qty 30

## 2019-03-25 MED ORDER — SODIUM CHLORIDE 0.9% FLUSH: 3.0000 mL | Freq: Two times a day (BID) | INTRAVENOUS | Status: DC

## 2019-03-25 MED ORDER — HEPARIN SODIUM (PORCINE) 1000 UNIT/ML IJ SOLN
INTRAMUSCULAR | Status: AC
  Filled 2019-03-25: qty 1

## 2019-03-25 MED ORDER — CLOPIDOGREL BISULFATE 300 MG PO TABS
ORAL_TABLET | ORAL | Status: AC
  Filled 2019-03-25: qty 1

## 2019-03-25 MED ORDER — MORPHINE SULFATE (PF) 2 MG/ML IV SOLN: 2.0000 mg | INTRAVENOUS | Status: DC | PRN

## 2019-03-25 MED ORDER — HEPARIN (PORCINE) IN NACL 1000-0.9 UT/500ML-% IV SOLN
INTRAVENOUS | Status: AC
  Filled 2019-03-25: qty 1000

## 2019-03-25 MED ORDER — MIDAZOLAM HCL 2 MG/2ML IJ SOLN
INTRAMUSCULAR | Status: AC
  Filled 2019-03-25: qty 2

## 2019-03-25 MED ORDER — NITROGLYCERIN 1 MG/10 ML FOR IR/CATH LAB
INTRA_ARTERIAL | Status: DC | PRN
  Administered 2019-03-25: 200 ug via INTRA_ARTERIAL

## 2019-03-25 MED ORDER — SODIUM CHLORIDE 0.9% FLUSH: 3.0000 mL | INTRAVENOUS | Status: DC | PRN

## 2019-03-25 MED ORDER — FENTANYL CITRATE (PF) 100 MCG/2ML IJ SOLN
INTRAMUSCULAR | Status: DC | PRN
  Administered 2019-03-25 (×2): 50 ug via INTRAVENOUS

## 2019-03-25 MED ORDER — IODIXANOL 320 MG/ML IV SOLN
INTRAVENOUS | Status: DC | PRN
  Administered 2019-03-25: 155 mL via INTRA_ARTERIAL

## 2019-03-25 MED ORDER — CLOPIDOGREL BISULFATE 75 MG PO TABS: 75.0000 mg | ORAL_TABLET | Freq: Every day | ORAL | 11 refills | Status: DC

## 2019-03-25 MED ORDER — NITROGLYCERIN 1 MG/10 ML FOR IR/CATH LAB
INTRA_ARTERIAL | Status: AC
  Filled 2019-03-25: qty 10

## 2019-03-25 MED ORDER — ACETAMINOPHEN 325 MG PO TABS: 650.0000 mg | ORAL_TABLET | ORAL | Status: DC | PRN

## 2019-03-25 MED ORDER — ASPIRIN EC 81 MG PO TBEC: 81.0000 mg | DELAYED_RELEASE_TABLET | Freq: Every day | ORAL | Status: DC

## 2019-03-25 MED ORDER — FENTANYL CITRATE (PF) 100 MCG/2ML IJ SOLN
INTRAMUSCULAR | Status: AC
  Filled 2019-03-25: qty 2

## 2019-03-25 MED ORDER — SODIUM CHLORIDE 0.9 % IV SOLN: 250.0000 mL | INTRAVENOUS | Status: DC | PRN

## 2019-03-25 MED ORDER — ONDANSETRON HCL 4 MG/2ML IJ SOLN: 4.0000 mg | Freq: Four times a day (QID) | INTRAMUSCULAR | Status: DC | PRN

## 2019-03-25 MED ORDER — CLOPIDOGREL BISULFATE 75 MG PO TABS: 75.0000 mg | ORAL_TABLET | Freq: Every day | ORAL | Status: DC

## 2019-03-25 MED ORDER — SODIUM CHLORIDE 0.9 % WEIGHT BASED INFUSION: 1.0000 mL/kg/h | INTRAVENOUS | Status: DC

## 2019-03-25 MED ORDER — HYDRALAZINE HCL 20 MG/ML IJ SOLN: 5.0000 mg | INTRAMUSCULAR | Status: DC | PRN

## 2019-03-25 MED ORDER — MIDAZOLAM HCL 2 MG/2ML IJ SOLN
INTRAMUSCULAR | Status: DC | PRN
  Administered 2019-03-25: 2 mg via INTRAVENOUS

## 2019-03-25 MED ORDER — LIDOCAINE HCL (PF) 1 % IJ SOLN
INTRAMUSCULAR | Status: DC | PRN
  Administered 2019-03-25: 40 mL via INTRADERMAL

## 2019-03-25 SURGICAL SUPPLY — 31 items
BALLN MUSTANG 6X120X135 (BALLOONS) ×3
BALLN STERLING OTW 6X100X135 (BALLOONS) ×3
BALLOON MUSTANG 6X120X135 (BALLOONS) ×2 IMPLANT
BALLOON STERLING OTW 6X100X135 (BALLOONS) ×2 IMPLANT
BUR JETSTREAM XC 2.4/3.4 (BURR) ×2 IMPLANT
BURR JETSTREAM XC 2.4/3.4 (BURR) ×3
CATH OMNI FLUSH 5F 65CM (CATHETERS) ×3 IMPLANT
CATH QUICKCROSS SUPP .018X90CM (MICROCATHETER) ×3 IMPLANT
CATH QUICKCROSS SUPP .035X90CM (MICROCATHETER) ×3 IMPLANT
CLOSURE MYNX CONTROL 5F (Vascular Products) ×3 IMPLANT
CLOSURE MYNX CONTROL 6F/7F (Vascular Products) ×3 IMPLANT
DEVICE EMBOSHIELD NAV6 4.0-7.0 (FILTER) ×3 IMPLANT
DEVICE TORQUE H2O (MISCELLANEOUS) ×3 IMPLANT
GUIDEWIRE ANGLED .035X150CM (WIRE) ×6 IMPLANT
KIT ENCORE 26 ADVANTAGE (KITS) ×3 IMPLANT
KIT MICROPUNCTURE NIT STIFF (SHEATH) ×3 IMPLANT
KIT PV (KITS) ×3 IMPLANT
LUBRICANT VIPERSLIDE CORONARY (MISCELLANEOUS) ×3 IMPLANT
SHEATH PINNACLE 5F 10CM (SHEATH) ×3 IMPLANT
SHEATH PINNACLE 6F 10CM (SHEATH) ×3 IMPLANT
SHEATH PINNACLE 7F 10CM (SHEATH) ×3 IMPLANT
SHEATH PINNACLE ST 7F 45CM (SHEATH) ×3 IMPLANT
SHEATH PROBE COVER 6X72 (BAG) ×3 IMPLANT
STENT ELUVIA 7X100X130 (Permanent Stent) ×3 IMPLANT
SYR MEDRAD MARK V 150ML (SYRINGE) ×3 IMPLANT
TAPE VIPERTRACK RADIOPAQ (MISCELLANEOUS) ×2 IMPLANT
TAPE VIPERTRACK RADIOPAQUE (MISCELLANEOUS) ×1
TRANSDUCER W/STOPCOCK (MISCELLANEOUS) ×3 IMPLANT
TRAY PV CATH (CUSTOM PROCEDURE TRAY) ×3 IMPLANT
WIRE BAREWIRE WORK .014X315CM (WIRE) ×3 IMPLANT
WIRE BENTSON .035X145CM (WIRE) ×3 IMPLANT

## 2019-03-25 NOTE — Op Note (Signed)
Patient name: George Webb MRN: [id number] DOB: 12-21-1955 Sex: male  03/25/2019 Pre-operative Diagnosis: Discolored left toes 2 through 5 Post-operative diagnosis:  Same Surgeon:  Durene Cal Procedure Performed:  1.  Ultrasound-guided access, right femoral artery  2.  Ultrasound-guided access, left femoral artery  3.  Abdominal aortogram  4.  Bilateral lower extremity runoff  5.  Atherectomy, left superficial femoral artery using jetstream  6.  Mechanical thrombectomy, left superficial femoral artery using jetstream  7.  Stent, left superficial femoral artery (7 x 100 Eluvia)  8.  Conscious sedation, 80 minutes  9.  Closure device, Mynx x2   Indications: The patient presented to my office yesterday with a 1 month history of painful discolored toes on the left foot.  His ABIs were depressed and so he comes in for angiogram  Procedure:  The patient was identified in the holding area and taken to room 8.  The patient was then placed supine on the table and prepped and draped in the usual sterile fashion.  A time out was called.  Conscious sedation was administered with the use of IV fentanyl and Versed under continuous physician and nurse monitoring.  Heart rate, blood pressure, and oxygen saturation were continuously monitored.  Total sedation time was 80 minutes.  Ultrasound was used to evaluate the right common femoral artery.  It was patent .  A digital ultrasound image was acquired.  A micropuncture needle was used to access the right common femoral artery under ultrasound guidance.  An 018 wire was advanced without resistance and a micropuncture sheath was placed.  The 018 wire was removed and a benson wire was placed.  The micropuncture sheath was exchanged for a 5 french sheath.  An omniflush catheter was advanced over the wire to the level of L-1.  An abdominal angiogram was obtained.  Next, the cath was pulled down to the bifurcation and bilateral runoff was performed   Findings:   Aortogram: No evidence of renal artery stenosis.  A stent graft is visualized within the infrarenal abdominal aorta.  The graft is in good position with no evidence of endoleak or stenosis.  Bilateral common and external iliac arteries are widely patent.  Right Lower Extremity: Right common femoral profundofemoral and superficial femoral and popliteal artery are widely patent.  There is three-vessel runoff to the ankle with posterior tibial being the dominant vessel across the ankle.  Left Lower Extremity: The left common femoral and profundofemoral artery widely patent.  Superficial femoral artery is patent down to the mid thigh where it has a short segment occlusion associated with atherosclerotic debris and calcification.  The popliteal artery below this is widely patent and three-vessel runoff is noted to the ankle with the posterior tibial being the dominant vessel across the ankle.  Intervention: After the above images were acquired the decision made to proceed with intervention via an antegrade approach.  The left common femoral artery was cannulated under ultrasound guidance with a micropuncture needle.  A wire was advanced into the superficial femoral artery followed by micropuncture sheath.  A Bentson wire was then inserted followed by a 7 French 45 cm Terumo sheath.  The patient was fully heparinized.  I then used a Glidewire and a quick cross and was able to easily cross the lesion with minimal resistance.  Because of the patient's symptomatology as well as the ease of crossing the lesion made me think that this was likely subacute thrombus.  I elected to place a  filter.  A large NAV 6 was placed over a bare wire.  I then elected to use the jetstream device as a thrombectomy and atherectomy device.  On initial pass with blades down, this was performed very slowly so as to maximize aspiration of anything that may be potentially mobile.  On the second pass this was done for atherectomy  purposes.  Next the jetstream was removed I elected to primarily stent to the lesion.  A 7 x 100 Elluvia was utilized and postdilated with a 6 mm balloon.  Completion imaging revealed resolution of the stenosis with no change in runoff.  Both groins were closed with a minx device  Impression:  #1  Short segment left superficial femoral artery occlusion with atherosclerotic debris and irregularity.  This was treated using the jetstream device as a mechanical thrombectomy device and an atherectomy device.  A 7 x 100 Elluvia stent was deployed across the lesion with no residual stenosis.  #2  High likelihood that the lesion identified was his embolic source to his toes    V. [name], M.D., [facility] [place] Office: [phone] Pager:  [phone]

## 2019-03-25 NOTE — Discharge Instructions (Signed)
Femoral Site Care °This sheet gives you information about how to care for yourself after your procedure. Your health care provider may also give you more specific instructions. If you have problems or questions, contact your health care provider. °What can I expect after the procedure? °After the procedure, it is common to have: °· Bruising that usually fades within 1-2 weeks. °· Tenderness at the site. °Follow these instructions at home: °Wound care °· Follow instructions from your health care provider about how to take care of your insertion site. Make sure you: °? Wash your hands with soap and water before you change your bandage (dressing). If soap and water are not available, use hand sanitizer. °? Change your dressing as told by your health care provider. °? Leave stitches (sutures), skin glue, or adhesive strips in place. These skin closures may need to stay in place for 2 weeks or longer. If adhesive strip edges start to loosen and curl up, you may trim the loose edges. Do not remove adhesive strips completely unless your health care provider tells you to do that. °· Do not take baths, swim, or use a hot tub until your health care provider approves. °· You may shower 24-48 hours after the procedure or as told by your health care provider. °? Gently wash the site with plain soap and water. °? Pat the area dry with a clean towel. °? Do not rub the site. This may cause bleeding. °· Do not apply powder or lotion to the site. Keep the site clean and dry. °· Check your femoral site every day for signs of infection. Check for: °? Redness, swelling, or pain. °? Fluid or blood. °? Warmth. °? Pus or a bad smell. °Activity °· For the first 2-3 days after your procedure, or as long as directed: °? Avoid climbing stairs as much as possible. °? Do not squat. °· Do not lift anything that is heavier than 10 lb (4.5 kg), or the limit that you are told, until your health care provider says that it is safe. °· Rest as  directed. °? Avoid sitting for a long time without moving. Get up to take short walks every 1-2 hours. °· Do not drive for 24 hours if you were given a medicine to help you relax (sedative). °General instructions °· Take over-the-counter and prescription medicines only as told by your health care provider. °· Keep all follow-up visits as told by your health care provider. This is important. °Contact a health care provider if you have: °· A fever or chills. °· You have redness, swelling, or pain around your insertion site. °Get help right away if: °· The catheter insertion area swells very fast. °· You pass out. °· You suddenly start to sweat or your skin gets clammy. °· The catheter insertion area is bleeding, and the bleeding does not stop when you hold steady pressure on the area. °· The area near or just beyond the catheter insertion site becomes pale, cool, tingly, or numb. °These symptoms may represent a serious problem that is an emergency. Do not wait to see if the symptoms will go away. Get medical help right away. Call your local emergency services (911 in the [place] Do not drive yourself to the hospital. °Summary °· After the procedure, it is common to have bruising that usually fades within 1-2 weeks. °· Check your femoral site every day for signs of infection. °· Do not lift anything that is heavier than 10 lb (4.5 kg), or the   limit that you are told, until your health care provider says that it is safe. °This information is not intended to replace advice given to you by your health care provider. Make sure you discuss any questions you have with your health care provider. °Document Released: 12/12/2013 Document Revised: 04/23/2017 Document Reviewed: 04/23/2017 °Elsevier Patient Education © 2020 [employer] ° °

## 2019-03-25 NOTE — Interval H&P Note (Signed)
History and Physical Interval Note:  03/25/2019 1:56 PM  George Webb  has presented today for surgery, with the diagnosis of pvd w/ claudication.  The various methods of treatment have been discussed with the patient and family. After consideration of risks, benefits and other options for treatment, the patient has consented to  Procedure(s): ABDOMINAL AORTOGRAM W/LOWER EXTREMITY (N/A) as a surgical intervention.  The patient's history has been reviewed, patient examined, no change in status, stable for surgery.  I have reviewed the patient's chart and labs.  Questions were answered to the patient's satisfaction.     [name]

## 2019-03-25 NOTE — Progress Notes (Signed)
K+ 5.6, this was a slow draw with possible hemolysis, Called Jennifer O'Neal Rn/ cath lab, updated, no further orders.

## 2019-03-25 NOTE — Progress Notes (Signed)
No bleeding or hematoma noted after ambulation.  Patient given discharge instructions and paper prescription for plavix.  Verbalized understanding

## 2019-03-26 ENCOUNTER — Encounter (HOSPITAL_COMMUNITY): Payer: Self-pay | Admitting: Surgery

## 2019-03-26 MED FILL — Clopidogrel Bisulfate Tab 300 MG (Base Equiv): ORAL | Qty: 1 | Status: AC

## 2019-04-03 ENCOUNTER — Telehealth: Payer: Self-pay | Admitting: *Deleted

## 2019-04-03 NOTE — Telephone Encounter (Signed)
Call to patient follow-up message left with answering service. No answer left message to call this office if needed.

## 2019-04-24 ENCOUNTER — Telehealth (HOSPITAL_COMMUNITY): Payer: Self-pay | Admitting: *Deleted

## 2019-04-24 NOTE — Telephone Encounter (Signed)

## 2019-04-28 ENCOUNTER — Other Ambulatory Visit: Payer: Self-pay | Admitting: *Deleted

## 2019-04-28 ENCOUNTER — Ambulatory Visit (INDEPENDENT_AMBULATORY_CARE_PROVIDER_SITE_OTHER)
Admission: RE | Admit: 2019-04-28 | Discharge: 2019-04-28 | Disposition: A | Discharge status: Home / Self Care | Payer: BC Managed Care – PPO | Source: Ambulatory Visit | Attending: Surgery | Admitting: Surgery

## 2019-04-28 ENCOUNTER — Ambulatory Visit (HOSPITAL_COMMUNITY)
Admission: RE | Admit: 2019-04-28 | Discharge: 2019-04-28 | Disposition: A | Discharge status: Home / Self Care | Payer: BC Managed Care – PPO | Source: Ambulatory Visit | Attending: Surgery | Admitting: Surgery

## 2019-04-28 ENCOUNTER — Encounter: Payer: Self-pay | Admitting: Surgery

## 2019-04-28 ENCOUNTER — Other Ambulatory Visit: Payer: Self-pay

## 2019-04-28 ENCOUNTER — Ambulatory Visit (INDEPENDENT_AMBULATORY_CARE_PROVIDER_SITE_OTHER): Payer: BC Managed Care – PPO | Admitting: Surgery

## 2019-04-28 VITALS — BP 120/78 | HR 84 | Temp 97.8°F | Resp 20 | Ht 72.0 in | Wt 264.0 lb

## 2019-04-28 DIAGNOSIS — I714 Abdominal aortic aneurysm, without rupture, unspecified: Secondary | ICD-10-CM

## 2019-04-28 DIAGNOSIS — I70229 Atherosclerosis of native arteries of extremities with rest pain, unspecified extremity: Secondary | ICD-10-CM

## 2019-04-28 DIAGNOSIS — I75022 Atheroembolism of left lower extremity: Secondary | ICD-10-CM | POA: Diagnosis not present

## 2019-04-28 MED ORDER — CLOPIDOGREL BISULFATE 75 MG PO TABS: 75.0000 mg | ORAL_TABLET | Freq: Every day | ORAL | 3 refills | Status: DC

## 2019-04-28 NOTE — Progress Notes (Signed)
[facility] [place]  Patient name: George Webb MRN: [id number] DOB: [birth year] Sex: male   REASON FOR VISIT:    Follow up  HISOTRY OF PRESENT ILLNESS:   George Webb is a 64 y.o. male, who I  had taken care of in the past but have not seen since 2013.  I performed endovascular repair of a 5.5 cm abdominal aortic aneurysm in October 2010.  He had a post-op CT scan that showed good position of his stent graft without evidence of endoleak and decrease in size of his aneurysm.  I saw him on 03-24-2019 for a 5-week history of painful blue toes on the left.  He denies any history of trauma.  There are no open wounds.  These do wake him up at night.  Therefore on 03/25/2019 he underwent angiography.  Identified a short segment left superficial femoral artery occlusion but did have atherosclerotic debris within it.  I performed mechanical thrombectomy using the jetstream device as well as atherectomy and then placed a 7 x 100 Elluvia stent across the lesion with no residual stenosis.  I felt that this had a high likelihood of being the culprit for his ischemic toes.  He was placed on aspirin and Plavix and is back for follow-up today.  He still has a very small open wound on the bottom of his left second toe however this has gotten significantly smaller.  His rest pain has essentially resolved.  The discoloration of his toes has also dramatically improved.  The patient does suffer from diabetes.  He is medically managed for hypertension with an ARB.  He takes a statin for hypercholesterolemia.  He is a current smoker.  PAST MEDICAL HISTORY:   Past Medical History:  Diagnosis Date  . AAA (abdominal aortic aneurysm) (HCC)   . Diabetes mellitus   . Hyperlipidemia   . Hypertension   . Pancreatitis      FAMILY HISTORY:   Family History  Problem Relation Age of Onset  . Heart disease Mother   . Diabetes Father   . Cancer  Sister     SOCIAL HISTORY:   Social History   Tobacco Use  . Smoking status: Current Every Day Smoker    Packs/day: 1.00    Years: 20.00    Pack years: 20.00    Types: Cigars  . Smokeless tobacco: Never Used  . Tobacco comment: avg 4-5 per day  Substance Use Topics  . Alcohol use: Yes    Alcohol/week: 1.0 standard drinks    Types: 1 Glasses of wine per week     ALLERGIES:   Allergies  Allergen Reactions  . Sulfa Antibiotics Nausea Only     CURRENT MEDICATIONS:   Current Outpatient Medications  Medication Sig Dispense Refill  . acetaminophen (TYLENOL) 500 MG tablet Take 1,000 mg by mouth every 6 (six) hours as needed for moderate pain or headache.    Marland Kitchen amLODipine (NORVASC) 10 MG tablet Take 10 mg by mouth at bedtime.     Marland Kitchen aspirin 81 MG tablet Take 81 mg by mouth at bedtime.     Marland Kitchen atorvastatin (LIPITOR) 40 MG tablet Take 40 mg by mouth at bedtime.     . chlorpheniramine (CHLOR-TRIMETON) 4 MG tablet Take 4 mg by mouth at bedtime.    . clopidogrel (PLAVIX) 75 MG tablet Take 1 tablet (75 mg total) by mouth daily. 30 tablet 11  . empagliflozin (JARDIANCE) 25 MG TABS tablet Take 25 mg by  mouth at bedtime.    . gabapentin (NEURONTIN) 600 MG tablet Take 600 mg by mouth at bedtime.     Marland Kitchen GLIPIZIDE XL 10 MG 24 hr tablet Take 10 mg by mouth at bedtime.     . Insulin Glargine, 1 Unit Dial, (TOUJEO SOLOSTAR) 300 UNIT/ML SOPN Inject 12 Units into the skin at bedtime.    Marland Kitchen losartan (COZAAR) 50 MG tablet Take 50 mg by mouth at bedtime.     . metFORMIN (GLUCOPHAGE-XR) 500 MG 24 hr tablet Take 2,000 mg by mouth at bedtime.      No current facility-administered medications for this visit.    REVIEW OF SYSTEMS:   [X]  denotes positive finding, [ ]  denotes negative finding Cardiac  Comments:  Chest pain or chest pressure:    Shortness of breath upon exertion:    Short of breath when lying flat:    Irregular heart rhythm:        Vascular    Pain in calf, thigh, or hip brought on  by ambulation:    Pain in feet at night that wakes you up from your sleep:  x   Blood clot in your veins:    Leg swelling:         Pulmonary    Oxygen at home:    Productive cough:     Wheezing:         Neurologic    Sudden weakness in arms or legs:     Sudden numbness in arms or legs:     Sudden onset of difficulty speaking or slurred speech:    Temporary loss of vision in one eye:     Problems with dizziness:         Gastrointestinal    Blood in stool:     Vomited blood:         Genitourinary    Burning when urinating:     Blood in urine:        Psychiatric    Major depression:         Hematologic    Bleeding problems:    Problems with blood clotting too easily:        Skin    Rashes or ulcers: x       Constitutional    Fever or chills:      PHYSICAL EXAM:   There were no vitals filed for this visit.  GENERAL: The patient is a well-nourished male, in no acute distress. The vital signs are documented above. CARDIAC: There is a regular rate and rhythm.  VASCULAR: Palpable left posterior tibial pulse PULMONARY: Non-labored respirations MUSCULOSKELETAL: There are no major deformities or cyanosis. NEUROLOGIC: No focal weakness or paresthesias are detected. SKIN: 1.5 x 1.5 mm superficial skin defect on the bottom of the left second toe PSYCHIATRIC: The patient has a normal affect.  STUDIES:   I have ordered and reviewed the following: AAA: Aorta was not well visualized however maximum diameter appears to be about 4.4 cm.  +-------+-----------+-----------+------------+------------+ ABI/TBIToday's ABIToday's TBIPrevious ABIPrevious TBI +-------+-----------+-----------+------------+------------+ Right  0.77       0.48                                +-------+-----------+-----------+------------+------------+ Left   1.06       0.76                                +-------+-----------+-----------+------------+------------+  Left leg  duplex: Right: Probable 50 - 74% stenosis in the profunda artery. the proximal femoral artery not well visualized due to calcific plaque.  Left: Patent left stent with no visualized stenosis.  MEDICAL ISSUES:   Atheroembolic disease of the left foot: The source of the atheromatous emboli was his left superficial femoral artery occlusion which has been treated.  He has had a significant improvement in the appearance of his left foot, as well as his symptoms.  The second toe has not completely healed but I suspect this will go on to heal within the next several weeks.  I would like for him to continue on dual antiplatelet therapy as well as statin therapy.  I have him scheduled for follow-up with me in 3 months with repeat ABIs and duplex of the left leg  AAA: There was limited visualization today however maximum diameter appeared to be 4.4 cm.  He will need a repeat aortic ultrasound in 1 year  Carotid: I would also like for the patient to get a carotid duplex at his next visit given his multiple risk factors.    Charlena Cross, MD, FACS [facility] of [facility] [phone] Pager [phone]

## 2019-05-02 ENCOUNTER — Other Ambulatory Visit: Payer: Self-pay | Admitting: *Deleted

## 2019-05-02 DIAGNOSIS — I70229 Atherosclerosis of native arteries of extremities with rest pain, unspecified extremity: Secondary | ICD-10-CM

## 2019-05-02 DIAGNOSIS — I714 Abdominal aortic aneurysm, without rupture, unspecified: Secondary | ICD-10-CM

## 2019-05-02 DIAGNOSIS — I75022 Atheroembolism of left lower extremity: Secondary | ICD-10-CM

## 2019-07-28 ENCOUNTER — Ambulatory Visit (HOSPITAL_COMMUNITY)
Admission: RE | Admit: 2019-07-28 | Discharge: 2019-07-28 | Disposition: A | Discharge status: Home / Self Care | Payer: BC Managed Care – PPO | Source: Ambulatory Visit | Attending: Surgery | Admitting: Surgery

## 2019-07-28 ENCOUNTER — Encounter: Payer: Self-pay | Admitting: Surgery

## 2019-07-28 ENCOUNTER — Ambulatory Visit (INDEPENDENT_AMBULATORY_CARE_PROVIDER_SITE_OTHER)
Admission: RE | Admit: 2019-07-28 | Discharge: 2019-07-28 | Disposition: A | Discharge status: Home / Self Care | Payer: BC Managed Care – PPO | Source: Ambulatory Visit | Attending: Surgery | Admitting: Surgery

## 2019-07-28 ENCOUNTER — Other Ambulatory Visit: Payer: Self-pay

## 2019-07-28 ENCOUNTER — Ambulatory Visit (INDEPENDENT_AMBULATORY_CARE_PROVIDER_SITE_OTHER): Payer: BC Managed Care – PPO | Admitting: Surgery

## 2019-07-28 VITALS — BP 130/71 | HR 82 | Temp 98.2°F | Resp 18 | Ht 72.0 in | Wt 266.0 lb

## 2019-07-28 DIAGNOSIS — I75022 Atheroembolism of left lower extremity: Secondary | ICD-10-CM

## 2019-07-28 DIAGNOSIS — I70229 Atherosclerosis of native arteries of extremities with rest pain, unspecified extremity: Secondary | ICD-10-CM

## 2019-07-28 DIAGNOSIS — I714 Abdominal aortic aneurysm, without rupture, unspecified: Secondary | ICD-10-CM

## 2019-07-28 NOTE — Progress Notes (Signed)
[facility] [place]  Patient name: George Webb MRN: [id number] DOB: [birth year] Sex: male   REASON FOR VISIT:    Follow up  HISOTRY OF PRESENT ILLNESS:    George Cappuccio Minteris a 64 y.o.male, whoI  had takencare of in the past but have not seen since 2013. I performed endovascular repair of a 5.5 cm abdominal aortic aneurysm in October 2010. He had a post-op CT scan that showed good position of his stent graft without evidence of endoleak and decrease in size of his aneurysm.  I saw him on 03-24-2019 for a 5-week history of painful blue toes on the left. He denies any history of trauma. There are no open wounds. These do wake him up at night.  Therefore on 03/25/2019 he underwent angiography.  Identified a short segment left superficial femoral artery occlusion but did have atherosclerotic debris within it.  I performed mechanical thrombectomy using the jetstream device as well as atherectomy and then placed a 7 x 100 Elluvia stent across the lesion with no residual stenosis.  I felt that this had a high likelihood of being the culprit for his ischemic toes.  He was placed on aspirin and Plavix.  He is back today for follow-up.  He does not have any complaints in his foot anymore.  He did have some drainage from a ingrown hair from his procedure that has resolved.   The patient does suffer from diabetes. He is medically managed for hypertension with an ARB. He takes a statin for hypercholesterolemia. He is a current smoker.  PAST MEDICAL HISTORY:   Past Medical History:  Diagnosis Date  . AAA (abdominal aortic aneurysm) (HCC)   . Diabetes mellitus   . Hyperlipidemia   . Hypertension   . Pancreatitis      FAMILY HISTORY:   Family History  Problem Relation Age of Onset  . Heart disease Mother   . Diabetes Father   . Cancer Sister     SOCIAL HISTORY:   Social History   Tobacco Use  . Smoking status:  Current Every Day Smoker    Packs/day: 1.00    Years: 20.00    Pack years: 20.00    Types: Cigars  . Smokeless tobacco: Never Used  . Tobacco comment: avg 4-5 per day  Substance Use Topics  . Alcohol use: Yes    Alcohol/week: 1.0 standard drinks    Types: 1 Glasses of wine per week     ALLERGIES:   Allergies  Allergen Reactions  . Sulfa Antibiotics Nausea Only     CURRENT MEDICATIONS:   Current Outpatient Medications  Medication Sig Dispense Refill  . acetaminophen (TYLENOL) 500 MG tablet Take 1,000 mg by mouth every 6 (six) hours as needed for moderate pain or headache.    Marland Kitchen amLODipine (NORVASC) 10 MG tablet Take 10 mg by mouth at bedtime.     Marland Kitchen aspirin 81 MG tablet Take 81 mg by mouth at bedtime.     Marland Kitchen atorvastatin (LIPITOR) 40 MG tablet Take 40 mg by mouth at bedtime.     . chlorpheniramine (CHLOR-TRIMETON) 4 MG tablet Take 4 mg by mouth at bedtime.    . clopidogrel (PLAVIX) 75 MG tablet Take 1 tablet (75 mg total) by mouth daily. 90 tablet 3  . empagliflozin (JARDIANCE) 25 MG TABS tablet Take 25 mg by mouth at bedtime.    . gabapentin (NEURONTIN) 600 MG tablet Take 600 mg by mouth at bedtime.     Marland Kitchen  GLIPIZIDE XL 10 MG 24 hr tablet Take 10 mg by mouth at bedtime.     . Insulin Glargine, 1 Unit Dial, (TOUJEO SOLOSTAR) 300 UNIT/ML SOPN Inject 12 Units into the skin at bedtime.    Marland Kitchen losartan (COZAAR) 50 MG tablet Take 50 mg by mouth at bedtime.     . metFORMIN (GLUCOPHAGE-XR) 500 MG 24 hr tablet Take 2,000 mg by mouth at bedtime.      No current facility-administered medications for this visit.    REVIEW OF SYSTEMS:   [X]  denotes positive finding, [ ]  denotes negative finding Cardiac  Comments:  Chest pain or chest pressure:    Shortness of breath upon exertion:    Short of breath when lying flat:    Irregular heart rhythm:        Vascular    Pain in calf, thigh, or hip brought on by ambulation:    Pain in feet at night that wakes you up from your sleep:     Blood  clot in your veins:    Leg swelling:         Pulmonary    Oxygen at home:    Productive cough:     Wheezing:         Neurologic    Sudden weakness in arms or legs:     Sudden numbness in arms or legs:     Sudden onset of difficulty speaking or slurred speech:    Temporary loss of vision in one eye:     Problems with dizziness:         Gastrointestinal    Blood in stool:     Vomited blood:         Genitourinary    Burning when urinating:     Blood in urine:        Psychiatric    Major depression:         Hematologic    Bleeding problems:    Problems with blood clotting too easily:        Skin    Rashes or ulcers:        Constitutional    Fever or chills:      PHYSICAL EXAM:   There were no vitals filed for this visit.  GENERAL: The patient is a well-nourished male, in no acute distress. The vital signs are documented above. CARDIAC: There is a regular rate and rhythm.  PULMONARY: Non-labored respirations ABDOMEN: Soft and non-tender  MUSCULOSKELETAL: There are no major deformities or cyanosis. NEUROLOGIC: No focal weakness or paresthesias are detected. SKIN: There are no ulcers or rashes noted. PSYCHIATRIC: The patient has a normal affect.  STUDIES:   I have reviewed the following:  LE DUPLEX Left: Patent left SFA stent with no visualized stenosis.   +-------+-----------+-----------+------------+------------+  ABI/TBIToday's ABIToday's TBIPrevious ABIPrevious TBI  +-------+-----------+-----------+------------+------------+  Right 0.67    0.42    0.77    0.48      +-------+-----------+-----------+------------+------------+  Left  0.99    0.75    1.06    0.76      +-------+-----------+-----------+------------+------------+   CAROTID: Right Carotid: Velocities in the right ICA are consistent with a 1-39%  stenosis.   Left Carotid: Velocities in the left ICA are consistent with a 1-39%  stenosis.    MEDICAL ISSUES:   AAA:  Follow up u/s in 1 year  Max diameter was 3.8 in 05-5954  Atheroembolic disease of left foot: The stent is widely patent in the left  superficial femoral artery.  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  He will follow-up in 1 year with repeat duplex studies.  Carotid: Duplex showed widely patent carotid arteries today.  I would not continue to follow this.    Charlena Cross, MD, FACS [facility] [phone] Pager [phone]

## 2019-07-30 ENCOUNTER — Other Ambulatory Visit: Payer: Self-pay | Admitting: *Deleted

## 2019-07-30 DIAGNOSIS — I714 Abdominal aortic aneurysm, without rupture, unspecified: Secondary | ICD-10-CM

## 2019-07-30 DIAGNOSIS — I75022 Atheroembolism of left lower extremity: Secondary | ICD-10-CM

## 2019-07-30 DIAGNOSIS — I70229 Atherosclerosis of native arteries of extremities with rest pain, unspecified extremity: Secondary | ICD-10-CM

## 2020-05-01 ENCOUNTER — Other Ambulatory Visit: Payer: Self-pay | Admitting: Surgery

## 2021-05-13 ENCOUNTER — Other Ambulatory Visit: Payer: Self-pay | Admitting: Vascular Surgery

## 2022-06-05 ENCOUNTER — Other Ambulatory Visit: Payer: Self-pay

## 2022-06-05 MED ORDER — CLOPIDOGREL BISULFATE 75 MG PO TABS: 75.0000 mg | ORAL_TABLET | Freq: Every day | ORAL | 0 refills | Status: AC

## 2022-06-08 ENCOUNTER — Other Ambulatory Visit: Payer: Self-pay | Admitting: *Deleted

## 2022-06-08 DIAGNOSIS — I739 Peripheral vascular disease, unspecified: Secondary | ICD-10-CM

## 2022-06-08 DIAGNOSIS — I714 Abdominal aortic aneurysm, without rupture, unspecified: Secondary | ICD-10-CM

## 2022-06-26 ENCOUNTER — Ambulatory Visit (INDEPENDENT_AMBULATORY_CARE_PROVIDER_SITE_OTHER)
Admission: RE | Admit: 2022-06-26 | Discharge: 2022-06-26 | Disposition: A | Discharge status: Home / Self Care | Payer: Medicare Other | Source: Ambulatory Visit | Attending: Surgery | Admitting: Surgery

## 2022-06-26 ENCOUNTER — Other Ambulatory Visit: Payer: Self-pay | Admitting: Surgery

## 2022-06-26 ENCOUNTER — Ambulatory Visit: Payer: Medicare Other | Admitting: Physician Assistant

## 2022-06-26 ENCOUNTER — Ambulatory Visit (HOSPITAL_COMMUNITY)
Admission: RE | Admit: 2022-06-26 | Discharge: 2022-06-26 | Disposition: A | Discharge status: Home / Self Care | Payer: Medicare Other | Source: Ambulatory Visit | Attending: Surgery | Admitting: Surgery

## 2022-06-26 VITALS — BP 133/73 | HR 66 | Temp 97.9°F | Resp 20 | Ht 72.0 in | Wt 273.7 lb

## 2022-06-26 DIAGNOSIS — Z9889 Other specified postprocedural states: Secondary | ICD-10-CM | POA: Diagnosis not present

## 2022-06-26 DIAGNOSIS — I714 Abdominal aortic aneurysm, without rupture, unspecified: Secondary | ICD-10-CM

## 2022-06-26 DIAGNOSIS — Z8679 Personal history of other diseases of the circulatory system: Secondary | ICD-10-CM

## 2022-06-26 DIAGNOSIS — I739 Peripheral vascular disease, unspecified: Secondary | ICD-10-CM

## 2022-06-26 LAB — VAS US ABI WITH/WO TBI
Left ABI: 0.94
Right ABI: 0.64

## 2022-06-26 NOTE — Progress Notes (Unsigned)
Office Note   History of Present Illness   George Webb is a 67 y.o. (1955-06-14) male who presents for surveillance of PAD and AAA.  He has a history of 5.5cm AAA that was repaired endovascularly in October 2010 by Dr. Trula Slade.  Current Outpatient Medications  Medication Sig Dispense Refill   acetaminophen (TYLENOL) 500 MG tablet Take 1,000 mg by mouth every 6 (six) hours as needed for moderate pain or headache.     amLODipine (NORVASC) 10 MG tablet Take 10 mg by mouth at bedtime.      aspirin 81 MG tablet Take 81 mg by mouth at bedtime.      atorvastatin (LIPITOR) 40 MG tablet Take 40 mg by mouth at bedtime.      chlorpheniramine (CHLOR-TRIMETON) 4 MG tablet Take 4 mg by mouth at bedtime.     clopidogrel (PLAVIX) 75 MG tablet Take 1 tablet (75 mg total) by mouth daily. 30 tablet 0   empagliflozin (JARDIANCE) 25 MG TABS tablet Take 25 mg by mouth at bedtime.     gabapentin (NEURONTIN) 600 MG tablet Take 600 mg by mouth at bedtime.      GLIPIZIDE XL 10 MG 24 hr tablet Take 10 mg by mouth at bedtime.      Insulin Glargine, 1 Unit Dial, (TOUJEO SOLOSTAR) 300 UNIT/ML SOPN Inject 12 Units into the skin at bedtime.     losartan (COZAAR) 50 MG tablet Take 50 mg by mouth at bedtime.      metFORMIN (GLUCOPHAGE-XR) 500 MG 24 hr tablet Take 2,000 mg by mouth at bedtime.      No current facility-administered medications for this visit.    ***REVIEW OF SYSTEMS (negative unless checked):   Cardiac:  '[]'$  Chest pain or chest pressure? '[]'$  Shortness of breath upon activity? '[]'$  Shortness of breath when lying flat? '[]'$  Irregular heart rhythm?  Vascular:  '[]'$  Pain in calf, thigh, or hip brought on by walking? '[]'$  Pain in feet at night that wakes you up from your sleep? '[]'$  Blood clot in your veins? '[]'$  Leg swelling?  Pulmonary:  '[]'$  Oxygen at home? '[]'$  Productive cough? '[]'$  Wheezing?  Neurologic:  '[]'$  Sudden weakness in arms or legs? '[]'$  Sudden numbness in arms or legs? '[]'$  Sudden onset of  difficult speaking or slurred speech? '[]'$  Temporary loss of vision in one eye? '[]'$  Problems with dizziness?  Gastrointestinal:  '[]'$  Blood in stool? '[]'$  Vomited blood?  Genitourinary:  '[]'$  Burning when urinating? '[]'$  Blood in urine?  Psychiatric:  '[]'$  Major depression  Hematologic:  '[]'$  Bleeding problems? '[]'$  Problems with blood clotting?  Dermatologic:  '[]'$  Rashes or ulcers?  Constitutional:  '[]'$  Fever or chills?  Ear/Nose/Throat:  '[]'$  Change in hearing? '[]'$  Nose bleeds? '[]'$  Sore throat?  Musculoskeletal:  '[]'$  Back pain? '[]'$  Joint pain? '[]'$  Muscle pain?   Physical Examination  *** Vitals:   06/26/22 0859  BP: 133/73  Pulse: 66  Resp: 20  Temp: 97.9 F (36.6 C)  TempSrc: Temporal  SpO2: 96%  Weight: 273 lb 11.2 oz (124.1 kg)  Height: 6' (1.829 m)   ***Body mass index is 37.12 kg/m.  General:  WDWN in NAD; vital signs documented above Gait: Not observed HENT: WNL, normocephalic Pulmonary: normal non-labored breathing , without Rales, rhonchi,  wheezing Cardiac: {Desc; regular/irreg:14544} HR, without  Murmurs {With/Without:20273} carotid bruit*** Abdomen: soft, NT, no masses Skin: {With/Without:20273} rashes Vascular Exam/Pulses:  Right Left  Radial {Exam; arterial pulse strength 0-4:30167} {Exam; arterial pulse strength 0-4:30167}  Ulnar {Exam; arterial pulse strength 0-4:30167} {Exam; arterial pulse strength 0-4:30167}  Femoral {Exam; arterial pulse strength 0-4:30167} {Exam; arterial pulse strength 0-4:30167}  Popliteal {Exam; arterial pulse strength 0-4:30167} {Exam; arterial pulse strength 0-4:30167}  DP {Exam; arterial pulse strength 0-4:30167} {Exam; arterial pulse strength 0-4:30167}  PT {Exam; arterial pulse strength 0-4:30167} {Exam; arterial pulse strength 0-4:30167}   Extremities: {With/Without:20273} ischemic changes, {With/Without:20273} Gangrene , {With/Without:20273} cellulitis; {With/Without:20273} open wounds;  Musculoskeletal: no muscle wasting or  atrophy  Neurologic: A&O X 3;  No focal weakness or paresthesias are detected Psychiatric:  The pt has {Desc; normal/abnormal:11317::"Normal"} affect.   Non-Invasive Vascular Imaging   AAA Duplex (***) Current size: *** cm Previous size: *** cm (***) R CIA: *** cm L CIA: *** cm   Medical Decision Making   George Webb is a 67 y.o. (04/17/1956) male who presents with: ***symptomatic AAA with ***increasing size.  Based on this patient's exam and diagnostic studies, he needs ***. The threshold for repair is AAA size > 5.5 cm, growth > 1 cm/yr, and symptomatic status. I emphasized the importance of maximal medical management including strict control of blood pressure, blood glucose, and lipid levels, antiplatelet agents, obtaining regular exercise, and cessation of smoking.   Thank you for allowing [place] to participate in this patient's care.   Dagoberto Ligas PA-C [facility] [place] Office: [phone]  Clinic MD: ***   History of endovascular repair of 5.5 cm AAA in October 2010.  Ischemic left toes in October 2020.  Atherectomy, mechanical thrombectomy, and stenting of left SFA in December 2020  AAA 1 year max diameter was 3.8/4.4 in 2021  5.57 today?  Proximal to SFA 286 65% blocked

## 2022-06-27 ENCOUNTER — Other Ambulatory Visit: Payer: Self-pay

## 2022-06-27 DIAGNOSIS — I739 Peripheral vascular disease, unspecified: Secondary | ICD-10-CM

## 2022-06-28 ENCOUNTER — Other Ambulatory Visit: Payer: Self-pay

## 2022-06-28 DIAGNOSIS — I714 Abdominal aortic aneurysm, without rupture, unspecified: Secondary | ICD-10-CM

## 2022-07-03 ENCOUNTER — Ambulatory Visit (HOSPITAL_COMMUNITY)
Admission: RE | Admit: 2022-07-03 | Discharge: 2022-07-03 | Disposition: A | Discharge status: Home / Self Care | Payer: Medicare Other | Source: Ambulatory Visit | Attending: Surgery | Admitting: Surgery

## 2022-07-03 DIAGNOSIS — I714 Abdominal aortic aneurysm, without rupture, unspecified: Secondary | ICD-10-CM

## 2022-07-03 DIAGNOSIS — E1110 Type 2 diabetes mellitus with ketoacidosis without coma: Secondary | ICD-10-CM | POA: Diagnosis present

## 2022-07-03 MED ORDER — IOHEXOL 350 MG/ML SOLN
75.0000 mL | Freq: Once | INTRAVENOUS | Status: AC | PRN
  Administered 2022-07-03: 75 mL via INTRAVENOUS

## 2022-07-17 ENCOUNTER — Ambulatory Visit (INDEPENDENT_AMBULATORY_CARE_PROVIDER_SITE_OTHER): Payer: Medicare Other | Admitting: Surgery

## 2022-07-17 ENCOUNTER — Encounter: Payer: Self-pay | Admitting: Surgery

## 2022-07-17 VITALS — BP 146/76 | HR 77 | Temp 98.2°F | Resp 20 | Ht 72.0 in | Wt 276.0 lb

## 2022-07-17 DIAGNOSIS — I70213 Atherosclerosis of native arteries of extremities with intermittent claudication, bilateral legs: Secondary | ICD-10-CM

## 2022-07-17 DIAGNOSIS — I7143 Infrarenal abdominal aortic aneurysm, without rupture: Secondary | ICD-10-CM

## 2022-07-17 MED ORDER — CLOPIDOGREL BISULFATE 75 MG PO TABS: 75.0000 mg | ORAL_TABLET | Freq: Every day | ORAL | 12 refills | Status: DC

## 2022-07-17 NOTE — Progress Notes (Signed)
[facility] [place]  Patient name: George Webb MRN: [id number] DOB: 05-Mar-1956 Sex: male   REASON FOR VISIT:    Follow up  HISOTRY OF PRESENT ILLNESS:    George Webb is a 66 y.o. male who is status post endovascular repair of a 5.5 cm abdominal aortic aneurysm in October 2010.  He was seen in the [facility] clinic 2 weeks ago.  There was no change in his ABIs.  The right was 0.64 on the left with 0.94.  There was no evidence of in-stent stenosis on the left however there was mild stenosis in the native SFA proximal to the stent.  He was found to have a maximum aortic diameter of 5.57 cm without evidence of endoleak.  This was an increase from 3.8 cm in 2021.  Therefore he was sent for a CT scan to better evaluate this.  I evaluated him in 2020 for painful blue toes on the left.  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  Mechanical thrombectomy with the jetstream device was performed and a 7 x 100 Eluvia stent was placed.  Patient is a diabetic.  He is medically managed for hypertension.  He is on a statin for hypercholesterolemia.  He is a smoker.   PAST MEDICAL HISTORY:   Past Medical History:  Diagnosis Date   AAA (abdominal aortic aneurysm) (HCC)    Diabetes mellitus    Hyperlipidemia    Hypertension    Pancreatitis      FAMILY HISTORY:   Family History  Problem Relation Age of Onset   Heart disease Mother    Diabetes Father    Cancer Sister     SOCIAL HISTORY:   Social History   Tobacco Use   Smoking status: Every Day    Packs/day: 1.00    Years: 20.00    Additional pack years: 0.00    Total pack years: 20.00    Types: Cigars, Cigarettes    Passive exposure: Never   Smokeless tobacco: Never   Tobacco comments:    avg 4-5 per day  Substance Use Topics   Alcohol use: Yes    Alcohol/week: 1.0 standard drink of  alcohol    Types: 1 Glasses of wine per week     ALLERGIES:   Allergies  Allergen Reactions   Sulfa Antibiotics Nausea Only   Cephalexin Diarrhea     CURRENT MEDICATIONS:   Current Outpatient Medications  Medication Sig Dispense Refill   acetaminophen (TYLENOL) 500 MG tablet Take 1,000 mg by mouth every 6 (six) hours as needed for moderate pain or headache.     amLODipine (NORVASC) 10 MG tablet Take 10 mg by mouth at bedtime.      aspirin 81 MG tablet Take 81 mg by mouth at bedtime.      atorvastatin (LIPITOR) 40 MG tablet Take 40 mg by mouth at bedtime.      chlorpheniramine (CHLOR-TRIMETON) 4 MG tablet Take 4 mg by mouth at bedtime.     empagliflozin (JARDIANCE) 25 MG TABS tablet Take 25 mg by mouth at bedtime.     gabapentin (NEURONTIN) 600 MG tablet Take 600 mg by mouth at bedtime.      GLIPIZIDE XL 10 MG 24 hr tablet Take 10 mg by mouth at bedtime.      Insulin Glargine, 1 Unit Dial, (TOUJEO SOLOSTAR) 300 UNIT/ML SOPN Inject 12 Units into the skin at bedtime.  losartan (COZAAR) 50 MG tablet Take 50 mg by mouth at bedtime.      metFORMIN (GLUCOPHAGE-XR) 500 MG 24 hr tablet Take 2,000 mg by mouth at bedtime.      No current facility-administered medications for this visit.    REVIEW OF SYSTEMS:   [X]  denotes positive finding, [ ]  denotes negative finding Cardiac  Comments:  Chest pain or chest pressure:    Shortness of breath upon exertion:    Short of breath when lying flat:    Irregular heart rhythm:        Vascular    Pain in calf, thigh, or hip brought on by ambulation:    Pain in feet at night that wakes you up from your sleep:     Blood clot in your veins:    Leg swelling:         Pulmonary    Oxygen at home:    Productive cough:     Wheezing:         Neurologic    Sudden weakness in arms or legs:     Sudden numbness in arms or legs:     Sudden onset of difficulty speaking or slurred speech:    Temporary loss of vision in one eye:     Problems  with dizziness:         Gastrointestinal    Blood in stool:     Vomited blood:         Genitourinary    Burning when urinating:     Blood in urine:        Psychiatric    Major depression:         Hematologic    Bleeding problems:    Problems with blood clotting too easily:        Skin    Rashes or ulcers:        Constitutional    Fever or chills:      PHYSICAL EXAM:   Vitals:   07/17/22 0941  BP: (!) 146/76  Pulse: 77  Resp: 20  Temp: 98.2 F (36.8 C)  SpO2: 95%  Weight: 276 lb (125.2 kg)  Height: 6' (1.829 m)    GENERAL: The patient is a well-nourished male, in no acute distress. The vital signs are documented above. CARDIAC: There is a regular rate and rhythm. PULMONARY: Non-labored respirations ABDOMEN: Soft and non-tende MUSCULOSKELETAL: There are no major deformities or cyanosis. NEUROLOGIC: No focal weakness or paresthesias are detected. SKIN: There are no ulcers or rashes noted. PSYCHIATRIC: The patient has a normal affect.  STUDIES:   I reviewed his CT scan with the following findings: 1. Patent infrarenal bifurcated stent graft with interval involution of the excluded 3.4 cm native aneurysm sac, no endoleak. 2. Segmental occlusion of the distal right common femoral artery, new since previous. Collaterals reconstitute SFA at its origin. 3. Short-segment stenosis of the proximal LEFT SFA of at least moderate severity. 4. Coronary and aortic Atherosclerosis (ICD10-I70.0). MEDICAL ISSUES:   AAA: Maximum diameter on CT scan was 3.4 cm with no evidence of endoleak.  Ultrasound was inaccurate.  He will need a follow-up ultrasound in 1 year  PAD: CT scan shows an occluded right common femoral artery.  He does not endorse symptoms of right leg claudication.  There is also stenosis within the native left superficial femoral artery, proximal to his left SFA stent.  He is scheduled to follow-up in June with a repeat ultrasound.    [name],  IV, MD,  FACS [facility] [phone] Pager [phone]

## 2022-07-17 NOTE — Addendum Note (Signed)
Addended by: Serafina Mitchell on: 07/17/2022 10:23 AM   Modules accepted: Orders

## 2022-10-09 ENCOUNTER — Encounter: Payer: Self-pay | Admitting: Surgery

## 2022-10-09 ENCOUNTER — Ambulatory Visit (HOSPITAL_COMMUNITY)
Admission: RE | Admit: 2022-10-09 | Discharge: 2022-10-09 | Disposition: A | Discharge status: Home / Self Care | Payer: Medicare Other | Source: Ambulatory Visit | Attending: Surgery | Admitting: Surgery

## 2022-10-09 ENCOUNTER — Ambulatory Visit (INDEPENDENT_AMBULATORY_CARE_PROVIDER_SITE_OTHER)
Admission: RE | Admit: 2022-10-09 | Discharge: 2022-10-09 | Disposition: A | Discharge status: Home / Self Care | Payer: Medicare Other | Source: Ambulatory Visit | Attending: Surgery | Admitting: Surgery

## 2022-10-09 ENCOUNTER — Ambulatory Visit (INDEPENDENT_AMBULATORY_CARE_PROVIDER_SITE_OTHER): Payer: Medicare Other | Admitting: Surgery

## 2022-10-09 VITALS — BP 152/85 | HR 71 | Temp 97.4°F | Resp 16 | Ht 72.0 in | Wt 274.0 lb

## 2022-10-09 DIAGNOSIS — I70213 Atherosclerosis of native arteries of extremities with intermittent claudication, bilateral legs: Secondary | ICD-10-CM | POA: Diagnosis not present

## 2022-10-09 DIAGNOSIS — I7143 Infrarenal abdominal aortic aneurysm, without rupture: Secondary | ICD-10-CM | POA: Diagnosis not present

## 2022-10-09 DIAGNOSIS — I739 Peripheral vascular disease, unspecified: Secondary | ICD-10-CM

## 2022-10-09 LAB — VAS US ABI WITH/WO TBI
Left ABI: 0.86
Right ABI: 0.62

## 2022-10-09 NOTE — Progress Notes (Signed)
Voice is noted to be hoarse.                                    [facility] [place]  Patient name: George Webb MRN: [id number] DOB: Feb 07, 1956 Sex: male   REASON FOR VISIT:    Follow up  HISOTRY OF PRESENT ILLNESS:      George Webb is a 67 y.o. male who is status post endovascular repair of a 5.5 cm abdominal aortic aneurysm in October 2010.   He was seen in the [facility] clinic in March 2024  There was no change in his ABIs.  The right was 0.64 on the left with 0.94.  There was no evidence of in-stent stenosis on the left however there was mild stenosis in the native SFA proximal to the stent.  He was found to have a maximum aortic diameter of 5.57 cm without evidence of endoleak.  This was an increase from 3.8 cm in 2021.  Therefore he was sent for a CT scan to better evaluate this.  On CT scan the maximum diameter is 3.4 cm.  There is no endoleak.  However, the scan also showed an occluded right common femoral artery he also has a stenosis within the left superficial femoral artery, proximal to this left SFA stent.  He is back for follow-up.  He has no new symptoms.  His daily routine is not impacted by leg pain.  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  Mechanical thrombectomy with the jetstream device was performed and a 7 x 100 Eluvia stent was placed.   Patient is a diabetic.  He is medically managed for hypertension.  He is on a statin for hypercholesterolemia.  He is a smoker.  PAST MEDICAL HISTORY:   Past Medical History:  Diagnosis Date   AAA (abdominal aortic aneurysm) (HCC)    Diabetes mellitus    Hyperlipidemia    Hypertension    Pancreatitis      FAMILY HISTORY:   Family History  Problem Relation Age of Onset   Heart  disease Mother    Diabetes Father    Cancer Sister     SOCIAL HISTORY:   Social History   Tobacco Use   Smoking status: Every Day    Packs/day: 1.00    Years: 20.00    Additional pack years: 0.00    Total pack years: 20.00    Types: Cigars, Cigarettes    Passive exposure: Never   Smokeless tobacco: Never   Tobacco comments:    avg 4-5 per day  Substance Use Topics   Alcohol use: Yes    Alcohol/week: 1.0 standard drink of alcohol    Types: 1 Glasses of wine per week     ALLERGIES:   Allergies  Allergen Reactions   Sulfa Antibiotics Nausea Only   Cephalexin Diarrhea     CURRENT MEDICATIONS:   Current Outpatient Medications  Medication Sig Dispense Refill   acetaminophen (TYLENOL) 500 MG tablet Take 1,000 mg by mouth every 6 (six) hours as needed for moderate pain or headache.     amLODipine (NORVASC) 10 MG tablet Take 10 mg by mouth at bedtime.  aspirin 81 MG tablet Take 81 mg by mouth at bedtime.      atorvastatin (LIPITOR) 40 MG tablet Take 40 mg by mouth at bedtime.      chlorpheniramine (CHLOR-TRIMETON) 4 MG tablet Take 4 mg by mouth at bedtime.     clopidogrel (PLAVIX) 75 MG tablet Take 1 tablet (75 mg total) by mouth daily. 30 tablet 12   empagliflozin (JARDIANCE) 25 MG TABS tablet Take 25 mg by mouth at bedtime.     gabapentin (NEURONTIN) 600 MG tablet Take 600 mg by mouth at bedtime.      GLIPIZIDE XL 10 MG 24 hr tablet Take 10 mg by mouth at bedtime.      Insulin Glargine, 1 Unit Dial, (TOUJEO SOLOSTAR) 300 UNIT/ML SOPN Inject 12 Units into the skin at bedtime.     losartan (COZAAR) 50 MG tablet Take 50 mg by mouth at bedtime.      metFORMIN (GLUCOPHAGE-XR) 500 MG 24 hr tablet Take 2,000 mg by mouth at bedtime.      No current facility-administered medications for this visit.    REVIEW OF SYSTEMS:   [X]  denotes positive finding, [ ]  denotes negative finding Cardiac  Comments:  Chest pain or chest pressure:    Shortness of breath upon exertion:     Short of breath when lying flat:    Irregular heart rhythm:        Vascular    Pain in calf, thigh, or hip brought on by ambulation:    Pain in feet at night that wakes you up from your sleep:     Blood clot in your veins:    Leg swelling:         Pulmonary    Oxygen at home:    Productive cough:     Wheezing:         Neurologic    Sudden weakness in arms or legs:     Sudden numbness in arms or legs:     Sudden onset of difficulty speaking or slurred speech:    Temporary loss of vision in one eye:     Problems with dizziness:         Gastrointestinal    Blood in stool:     Vomited blood:         Genitourinary    Burning when urinating:     Blood in urine:        Psychiatric    Major depression:         Hematologic    Bleeding problems:    Problems with blood clotting too easily:        Skin    Rashes or ulcers:        Constitutional    Fever or chills:      PHYSICAL EXAM:   Vitals:   10/09/22 1413  BP: (!) 152/85  Pulse: 71  Resp: 16  Temp: (!) 97.4 F (36.3 C)  TempSrc: Temporal  SpO2: 97%  Weight: 274 lb (124.3 kg)  Height: 6' (1.829 m)    GENERAL: The patient is a well-nourished male, in no acute distress. The vital signs are documented above. CARDIAC: There is a regular rate and rhythm.  VASCULAR: Nonpalpable pedal pulses PULMONARY: Non-labored respirations ABDOMEN: Soft and non-tender  MUSCULOSKELETAL: There are no major deformities or cyanosis. NEUROLOGIC: No focal weakness or paresthesias are detected. SKIN: There are no ulcers or rashes noted. PSYCHIATRIC: The patient has a normal affect.  STUDIES:   I  have reviewed the following: ABI/TBIToday's ABIToday's TBIPrevious ABIPrevious TBI  +-------+-----------+-----------+------------+------------+  Right 0.62       0.54       0.64        0.56          +-------+-----------+-----------+------------+------------+  Left  0.86       0.66       0.94        0.85           +-------+-----------+-----------+------------+------------+  Left: 50-74% stenosis noted in the superficial femoral artery. Patent  stent with increased velocity in the distal segment and outflow in the 50  - 99% stenosis range, limited visualztion.   MEDICAL ISSUES:    PAD: The patient has stenosis on the left in his native artery proximal to the stents.  The highest velocity visualized is 282 cm/s.  He does not have any claudication symptoms on the left.  On the right his CT scan showed a interval occlusion of the right common femoral artery.  Similarly, he does not have lifestyle limiting symptoms on the right.  His ABIs have remained stable.  I discussed that as long as his symptoms are stable, I would not recommend intervention.  I will repeat his ultrasound in 6 months.  He knows to evaluate his feet on a daily basis and contact me for nonhealing wound or change in his symptoms.  AAA: CT scan recently showed an aorta with maximum diameter 3.4 cm.  He will need a follow-up ultrasound in May 2025    Charlena Cross, MD, FACS [facility] [phone] Pager [phone]

## 2022-10-18 ENCOUNTER — Other Ambulatory Visit: Payer: Self-pay

## 2022-10-18 DIAGNOSIS — I7143 Infrarenal abdominal aortic aneurysm, without rupture: Secondary | ICD-10-CM

## 2022-10-18 DIAGNOSIS — I70213 Atherosclerosis of native arteries of extremities with intermittent claudication, bilateral legs: Secondary | ICD-10-CM

## 2023-03-26 ENCOUNTER — Encounter (HOSPITAL_COMMUNITY): Payer: PRIVATE HEALTH INSURANCE

## 2023-03-26 ENCOUNTER — Ambulatory Visit: Payer: PRIVATE HEALTH INSURANCE | Admitting: Surgery

## 2023-03-28 ENCOUNTER — Encounter: Payer: Self-pay | Admitting: Endocrinology

## 2023-03-28 ENCOUNTER — Ambulatory Visit (INDEPENDENT_AMBULATORY_CARE_PROVIDER_SITE_OTHER): Payer: Medicare Other | Admitting: Endocrinology

## 2023-03-28 VITALS — BP 130/60 | HR 70 | Resp 20 | Ht 72.0 in | Wt 266.6 lb

## 2023-03-28 DIAGNOSIS — Z7985 Long-term (current) use of injectable non-insulin antidiabetic drugs: Secondary | ICD-10-CM | POA: Diagnosis not present

## 2023-03-28 DIAGNOSIS — Z7984 Long term (current) use of oral hypoglycemic drugs: Secondary | ICD-10-CM | POA: Diagnosis not present

## 2023-03-28 DIAGNOSIS — E1165 Type 2 diabetes mellitus with hyperglycemia: Secondary | ICD-10-CM | POA: Diagnosis not present

## 2023-03-28 LAB — POCT GLYCOSYLATED HEMOGLOBIN (HGB A1C): Hemoglobin A1C: 10.5 % — AB (ref 4.0–5.6)

## 2023-03-28 MED ORDER — DEXCOM G7 RECEIVER DEVI: 1.0000 | 0 refills | Status: DC

## 2023-03-28 MED ORDER — PIOGLITAZONE HCL 30 MG PO TABS: 30.0000 mg | ORAL_TABLET | Freq: Every day | ORAL | 3 refills | Status: DC

## 2023-03-28 MED ORDER — DEXCOM G7 SENSOR MISC: 1.0000 | 0 refills | Status: DC

## 2023-03-28 NOTE — Progress Notes (Signed)
Outpatient Endocrinology Note [place] Leer, MD  03/28/23  Patient's Name: George Webb    DOB: 04/25/1955    MRN: [id number]                                                    REASON OF VISIT: New consult for type 2 diabetes mellitus  REFERRING PROVIDER: Encarnacion Slates, PA-C  PCP: Encarnacion Slates, PA-C  HISTORY OF PRESENT ILLNESS:   George Webb is a 67 y.o. old male with past medical history listed below, is here for new consult for type 2 diabetes mellitus.   Pertinent Diabetes History: Patient was diagnosed with type 2 diabetes mellitus.  Patient has uncontrolled type 2 diabetes mellitus with hemoglobin A1c in the range of 9-10 send report to endocrinology for evaluation and management.  Patient reports he had tried different anti diabetic medications including injectable in the past, does not recall the details name and no records available to review.  Hemoglobin A1c in the clinic today 10.5%.  Patient reports he had cortisone injection into the back recently and also took prednisone for few days for bronchitis in last few weeks.  Patient reports had seen dietitian in the past from PCP office.  Previous diabetes education: Yes   Family h/o diabetes mellitus: no.  Denies family history of diabetes mellitus.  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 Younger sister with history of pancreatic cancer.   Chronic Diabetes Complications : Retinopathy: unknown. Last ophthalmology exam was done on Due.  Nephropathy: no, on ACE/ARB / Losartan Peripheral neuropathy: yes, on , has PAD, following with vascular. On gabapentin 600 mg bedtime. Abdominal aortic aneurysm.  Coronary artery disease: no Stroke: no  Relevant comorbidities and cardiovascular risk factors: Obesity: yes Body mass index is 36.16 kg/m.  Hypertension: Yes  Hyperlipidemia : Yes, on statin   Current / Home Diabetic regimen includes:  He reports Toujeo was started 12  units daily and gradually increased, currently taking 69 units daily at bedtime.  Toujeo 69 units daily at bedtime.  Farxiga 10 mg daily. Glipizide XL 10 mg at bedtime.  Metformin XR 2000 mg daily at bedtime.   Prior diabetic medications: ? [place] : sickness Jardiance , switched to farxiga.  Trulicity he had taken in the past caused stomach upset and pain and he felt like he had pancreatitis.  Glycemic data:   He has ReliOn glucometer, he has not been checking blood sugar lately however in the past he had checked few weeks ago: Blood sugar reviewed from the meter as follows. 158, 118, 155, 205, 149, 153, 212, 204, 202, 146, 186.   Hypoglycemia: Patient has no hypoglycemic episodes. Patient has hypoglycemia awareness.  Factors modifying glucose control: 1.  Diabetic diet assessment: 3 meals a day, snacks in between the meals.  He likes to drink a regular soft drink, potato pasta and bread.  He adds 3 to 4 packets of sugar in the coffee.  2.  Staying active or exercising: No formal exercise.  Limited physical activity at home.  3.  Medication compliance: compliant most of the time.  Interval history  Patient presented for evaluation and management of uncontrolled type 2 diabetes mellitus.  Patient is accompanied by wife in the clinic today.  REVIEW OF SYSTEMS As per history of  present illness.   PAST MEDICAL HISTORY: Past Medical History:  Diagnosis Date   AAA (abdominal aortic aneurysm) (HCC)    Diabetes mellitus    Hyperlipidemia    Hypertension    Pancreatitis     PAST SURGICAL HISTORY: Past Surgical History:  Procedure Laterality Date   ABDOMINAL AORTIC ANEURYSM REPAIR  02-04-2009   EVAR   ABDOMINAL AORTOGRAM W/LOWER EXTREMITY N/A 03/25/2019   Procedure: ABDOMINAL AORTOGRAM W/LOWER EXTREMITY;  Surgeon: Nada Libman, MD;  Location: [facility] IN[facility]IVE [facility];  Service: Cardiovascular;  Laterality: N/A;   CHOLECYSTECTOMY  Sept. 2008   PERIPHERAL VASCULAR ATHERECTOMY Left  03/25/2019   Procedure: PERIPHERAL VASCULAR ATHERECTOMY;  Surgeon: Nada Libman, MD;  Location: [facility] INVASIVE CV LAB;  Service: Cardiovascular;  Laterality: Left;  superficiacl femoral   PERIPHERAL VASCULAR INTERVENTION Left 03/25/2019   Procedure: PERIPHERAL VASCULAR INTERVENTION;  Surgeon: Nada Libman, MD;  Location: [facility] INVASIVE CV [facility];  Service: Cardiovascular;  Laterality: Left;  superficial femoral    ALLERGIES: Allergies  Allergen Reactions   Sulfa Antibiotics Nausea Only   Cephalexin Diarrhea    FAMILY HISTORY:  Family History  Problem Relation Age of Onset   Heart disease Mother    Diabetes Father    Cancer Sister     SOCIAL HISTORY: Social History   Socioeconomic History   Marital status: Married    Spouse name: Not on file   Number of children: Not on file   Years of education: Not on file   Highest education level: Not on file  Occupational History   Not on file  Tobacco Use   Smoking status: Every Day    Current packs/day: 1.00    Average packs/day: 1 pack/day for 20.0 years (20.0 ttl pk-yrs)    Types: Cigars, Cigarettes    Passive exposure: Never   Smokeless tobacco: Never   Tobacco comments:    avg 4-5 per day  Vaping Use   Vaping status: Never Used  Substance and Sexual Activity   Alcohol use: Yes    Alcohol/week: 1.0 standard drink of alcohol    Types: 1 Glasses of wine per week   Drug use: No   Sexual activity: Not on file  Other Topics Concern   Not on file  Social History Narrative   Not on file   Social Determinants of Health   Financial Resource Strain: Not on file  Food Insecurity: Not on file  Transportation Needs: Not on file  Physical Activity: Not on file  Stress: Not on file  Social Connections: Not on file    MEDICATIONS:  Current Outpatient Medications  Medication Sig Dispense Refill   acetaminophen (TYLENOL) 500 MG tablet Take 1,000 mg by mouth every 6 (six) hours as needed for moderate pain or headache.      amLODipine (NORVASC) 10 MG tablet Take 10 mg by mouth at bedtime.      aspirin 81 MG tablet Take 81 mg by mouth at bedtime.      atorvastatin (LIPITOR) 40 MG tablet Take 40 mg by mouth at bedtime.      chlorpheniramine (CHLOR-TRIMETON) 4 MG tablet Take 4 mg by mouth at bedtime.     Continuous Glucose Receiver (DEXCOM G7 RECEIVER) DEVI 1 Device by Does not apply route continuous. 1 each 0   Continuous Glucose Sensor (DEXCOM G7 SENSOR) MISC 1 Device by Does not apply route continuous. 9 each 0   dapagliflozin propanediol (FARXIGA) 10 MG TABS tablet Take 10 mg by mouth  at bedtime.     gabapentin (NEURONTIN) 600 MG tablet Take 600 mg by mouth at bedtime.      GLIPIZIDE XL 10 MG 24 hr tablet Take 10 mg by mouth at bedtime.      Insulin Glargine, 1 Unit Dial, (TOUJEO SOLOSTAR) 300 UNIT/ML SOPN Inject 12 Units into the skin at bedtime.     losartan (COZAAR) 50 MG tablet Take 50 mg by mouth at bedtime.      metFORMIN (GLUCOPHAGE-XR) 500 MG 24 hr tablet Take 2,000 mg by mouth at bedtime.      pioglitazone (ACTOS) 30 MG tablet Take 1 tablet (30 mg total) by mouth daily. 90 tablet 3   clopidogrel (PLAVIX) 75 MG tablet Take 1 tablet (75 mg total) by mouth daily. (Patient not taking: Reported on 03/28/2023) 30 tablet 12   empagliflozin (JARDIANCE) 25 MG TABS tablet Take 25 mg by mouth at bedtime. (Patient not taking: Reported on 03/28/2023)     No current facility-administered medications for this visit.    PHYSICAL EXAM: Vitals:   03/28/23 1454  BP: 130/60  Pulse: 70  Resp: 20  SpO2: 97%  Weight: 266 lb 9.6 oz (120.9 kg)  Height: 6' (1.829 m)   Body mass index is 36.16 kg/m.  Wt Readings from Last 3 Encounters:  03/28/23 266 lb 9.6 oz (120.9 kg)  10/09/22 274 lb (124.3 kg)  07/17/22 276 lb (125.2 kg)    General: Well developed, well nourished male in no apparent distress.  HEENT: AT/[place], no external lesions.  Eyes: Conjunctiva clear and no icterus. Neck: Neck supple  Lungs: Respirations not  labored Neurologic: Alert, oriented, normal speech Extremities / Skin: Dry. No sores or rashes noted.  Psychiatric: Does not appear depressed or anxious  Diabetic Foot Exam - Simple   No data filed     LABS Reviewed Lab Results  Component Value Date   HGBA1C 10.5 (A) 03/28/2023   No results found for: "FRUCTOSAMINE" No results found for: "CHOL", "HDL", "LDLCALC", "LDLDIRECT", "TRIG", "CHOLHDL" No results found for: "MICRALBCREAT" Lab Results  Component Value Date   CREATININE 0.90 03/25/2019   No results found for: "GFR"  ASSESSMENT / PLAN  1. Uncontrolled type 2 diabetes mellitus with hyperglycemia (HCC)    Diabetes Mellitus type 2, complicated by diabetic neuropathy / PAD.  - Diabetic status / severity: Uncontrolled.  Lab Results  Component Value Date   HGBA1C 10.5 (A) 03/28/2023    - Hemoglobin A1c goal : <7%  Discussed about type 2 diabetes mellitus, potential chronic complications including retinopathy/nephropathy and neuropathy.  Discussed about importance of controlling blood sugar.  Discussed in detail about lifestyle modification is the most important to control type 2 diabetes mellitus including diet control and exercise/increased physical activity.  I will avoid GLP-1 receptor agonist due to history of pancreatitis and family history of pancreatic cancer.  - Medications: See below.  I) increase Toujeo to 72 units daily.  He is currently taking 69 units daily. II) continue Farxiga 10 mg daily. III) continue glipizide XL 10 mg daily. IV) adjust metformin extended release 1000 mg with meals 2 times a day.  He is currently taking 2000 mg at bedtime. V) start pioglitazone/Actos 30 mg daily.  Discussed that if there are is no acceptable improvement on diabetes control, will consider mealtime insulin in next follow-up visit.  - Home glucose testing: At least 2 times a day in the morning fasting and bedtime.  I sent prescription for Dexcom G7.  Discussed that  having continuous glucose monitoring a self-help to regulate eating habits and adjusting food choices and portion control for the improvement of diabetes.  - Discussed/ Gave Hypoglycemia treatment plan.  # Consult : not required at this time.   # Annual urine for microalbuminuria/ creatinine ratio, no microalbuminuria currently, continue ACE/ARB/losartan.  Will check today with BMP and urine microalbumin creatinine ratio. Last No results found for: "MICRALBCREAT"  # Foot check nightly / neuropathy, continue [place] 600 mg at bedtime, managed by PCP.  # Annual dilated diabetic eye exams.  Advised for diabetic eye exam.  He is due for it.  - Diet: Make healthy diabetic food choices, patient had seen dietitian locally in the past. - Life style / activity / exercise: Discussed.   2. Blood pressure  -  BP Readings from Last 1 Encounters:  03/28/23 130/60    - Control is in target.  - No change in current plans.  3. Lipid status / Hyperlipidemia - Last No results found for: "LDLCALC" - Continue atorvastatin 40 mg daily, managed by PCP.  Diagnoses and all orders for this visit:  Uncontrolled type 2 diabetes mellitus with hyperglycemia (HCC) -     POCT glycosylated hemoglobin (Hb A1C) -     Basic metabolic panel -     Microalbumin / creatinine urine ratio  Other orders -     pioglitazone (ACTOS) 30 MG tablet; Take 1 tablet (30 mg total) by mouth daily. -     Continuous Glucose Receiver (DEXCOM G7 RECEIVER) DEVI; 1 Device by Does not apply route continuous. -     Continuous Glucose Sensor (DEXCOM G7 SENSOR) MISC; 1 Device by Does not apply route continuous.    DISPOSITION Follow up in clinic in 3  months suggested.   All questions answered and patient verbalized understanding of the plan.  [place] Esdras Delair, MD [facility] Endocrinology [facility] [address] [place], [address] [place], [place] [address] Phone # [phone]  At least part of this note was generated using  voice recognition software. Inadvertent word errors may have occurred, which were not recognized during the proofreading process.

## 2023-03-28 NOTE — Patient Instructions (Addendum)
Diabetes regimen:  Increase Toujeo 72 units daily. Metformin XR 1000 mg with meals, 2 times a day with meals. Farxiga 10 mg daily. Glipizide XL 10 mg daily.  Start actos 30 mg daily.  Sensor DEXCOM G7 sent to pharmacy.  Improve diet and increase physical activity.  Have diabetic eye exam.

## 2023-03-29 LAB — BASIC METABOLIC PANEL
BUN: 14 mg/dL (ref 7–25)
CO2: 27 mmol/L (ref 20–32)
Calcium: 9.4 mg/dL (ref 8.6–10.3)
Chloride: 104 mmol/L (ref 98–110)
Creat: 0.91 mg/dL (ref 0.70–1.35)
Glucose, Bld: 143 mg/dL — ABNORMAL HIGH (ref 65–99)
Potassium: 4.8 mmol/L (ref 3.5–5.3)
Sodium: 138 mmol/L (ref 135–146)

## 2023-03-29 LAB — MICROALBUMIN / CREATININE URINE RATIO
Creatinine, Urine: 143 mg/dL (ref 20–320)
Microalb Creat Ratio: 1173 mg/g{creat} — ABNORMAL HIGH (ref <30)
Microalb, Ur: 167.7 mg/dL

## 2023-05-28 ENCOUNTER — Ambulatory Visit (HOSPITAL_COMMUNITY): Payer: Medicare Other

## 2023-05-28 ENCOUNTER — Ambulatory Visit: Payer: PRIVATE HEALTH INSURANCE | Admitting: Surgery

## 2023-06-26 ENCOUNTER — Ambulatory Visit: Payer: PRIVATE HEALTH INSURANCE | Admitting: Endocrinology

## 2023-07-02 ENCOUNTER — Encounter (HOSPITAL_COMMUNITY): Payer: Medicare Other

## 2023-07-02 ENCOUNTER — Ambulatory Visit: Payer: PRIVATE HEALTH INSURANCE | Admitting: Surgery

## 2023-08-01 ENCOUNTER — Ambulatory Visit: Payer: PRIVATE HEALTH INSURANCE | Admitting: Endocrinology

## 2023-08-10 ENCOUNTER — Other Ambulatory Visit: Payer: Self-pay | Admitting: Surgery

## 2023-08-13 ENCOUNTER — Telehealth: Payer: Self-pay

## 2023-08-13 NOTE — Telephone Encounter (Signed)
 Medication Refill: -spouse LM on VM after hours 08/10/23 for Plavix  refill, had 2 days left - going on vacation  -refill sent this am to [facility].  LM for pt to call the office and may be able to send refill to closest pharmacy

## 2023-09-04 ENCOUNTER — Encounter: Payer: Self-pay | Admitting: Endocrinology

## 2023-09-04 ENCOUNTER — Ambulatory Visit (INDEPENDENT_AMBULATORY_CARE_PROVIDER_SITE_OTHER): Admitting: Endocrinology

## 2023-09-04 ENCOUNTER — Ambulatory Visit: Payer: Self-pay | Admitting: Endocrinology

## 2023-09-04 VITALS — BP 134/60 | HR 78 | Resp 20 | Ht 72.0 in | Wt 283.6 lb

## 2023-09-04 DIAGNOSIS — Z7984 Long term (current) use of oral hypoglycemic drugs: Secondary | ICD-10-CM

## 2023-09-04 DIAGNOSIS — Z794 Long term (current) use of insulin: Secondary | ICD-10-CM

## 2023-09-04 DIAGNOSIS — E1165 Type 2 diabetes mellitus with hyperglycemia: Secondary | ICD-10-CM | POA: Diagnosis not present

## 2023-09-04 LAB — POCT GLYCOSYLATED HEMOGLOBIN (HGB A1C): Hemoglobin A1C: 7.3 % — AB (ref 4.0–5.6)

## 2023-09-04 MED ORDER — METFORMIN HCL ER 500 MG PO TB24: 1000.0000 mg | ORAL_TABLET | Freq: Two times a day (BID) | ORAL | 3 refills | Status: AC

## 2023-09-04 MED ORDER — GLIPIZIDE XL 10 MG PO TB24: 10.0000 mg | ORAL_TABLET | Freq: Every day | ORAL | 3 refills | Status: AC

## 2023-09-04 MED ORDER — TOUJEO SOLOSTAR 300 UNIT/ML ~~LOC~~ SOPN: 70.0000 [IU] | PEN_INJECTOR | Freq: Every day | SUBCUTANEOUS | 4 refills | Status: DC

## 2023-09-04 MED ORDER — DAPAGLIFLOZIN PROPANEDIOL 10 MG PO TABS: 10.0000 mg | ORAL_TABLET | Freq: Every day | ORAL | 3 refills | Status: DC

## 2023-09-04 MED ORDER — PIOGLITAZONE HCL 30 MG PO TABS: 30.0000 mg | ORAL_TABLET | Freq: Every day | ORAL | 3 refills | Status: AC

## 2023-09-04 NOTE — Progress Notes (Signed)
 Outpatient Endocrinology Note George Nyelle Wolfson, MD  09/04/23  Patient's Name: George Webb    DOB: 11/22/1955    MRN: [id number]                                                    REASON OF VISIT: Follow-up for type 2 diabetes mellitus  REFERRING PROVIDER: Anthonette Bastos, PA-C  PCP: Anthonette Bastos, PA-C  HISTORY OF PRESENT ILLNESS:   George Webb is a 68 y.o. old male with past medical history listed below, is here for follow-up for type 2 diabetes mellitus.   Pertinent Diabetes History: Patient was diagnosed with type 2 diabetes mellitus.  Patient has uncontrolled type 2 diabetes mellitus with hemoglobin A1c in the range of 9-10 send report to endocrinology for evaluation and management.  Patient reports he had tried different anti diabetic medications including injectable in the past, does not recall the details name and no records available to review.  Hemoglobin A1c in December 2024 was 10.5%.  He had taken cortisone injection into the back and prednisone prior to the that time.  Ischial visit was in March 28, 2023.  Patient reports had seen dietitian in the past from PCP office.  Previous diabetes education: Yes   Family h/o diabetes mellitus: no.  Denies family history of diabetes mellitus.  He has h/o pancreatitis multiple times in 1990s, pancrease divisum, evaluated at [facility]. Younger sister with history of pancreatic cancer.   Chronic Diabetes Complications : Retinopathy: unknown. Last ophthalmology exam was done on Due.  Nephropathy: no, on ACE/ARB / Losartan Peripheral neuropathy: yes, on , has PAD, following with vascular. On gabapentin 600 mg bedtime. Abdominal aortic aneurysm.  Coronary artery disease: no Stroke: no  Relevant comorbidities and cardiovascular risk factors: Obesity: yes Body mass index is 38.46 kg/m.  Hypertension: Yes  Hyperlipidemia : Yes, on statin   Current / Home Diabetic regimen includes:  Toujeo 70 nits daily at bedtime.  Farxiga 10 mg  daily. Glipizide XL 10 mg at bedtime.  Metformin XR 2000 mg daily at bedtime.  Actos /pioglitazone  30 mg daily.  Prior diabetic medications: ? [place] : sickness Jardiance , switched to farxiga.  Trulicity he had taken in the past caused stomach upset and pain and he felt like he had pancreatitis.  Glycemic data:   He has not been checking blood sugar at home.  No glucose data to review. He has ReliOn glucometer.  Hypoglycemia: Patient has no hypoglycemic episodes. Patient has hypoglycemia awareness.  Factors modifying glucose control: 1.  Diabetic diet assessment: 3 meals a day, snacks in between the meals.  He likes to drink a regular soft drink, potato pasta and bread.  He adds 3 to 4 packets of sugar in the coffee.  2.  Staying active or exercising: No formal exercise.  Limited physical activity at home.  3.  Medication compliance: compliant most of the time.  Interval history  Patient had a fall and left tibia fracture in February 2025 requiring hospitalization and rehab for few weeks, he was not weightbearing on wheelchair for some time and recently started to walk on cane.  He completed physical therapy.  Patient states that his daughter was recently diagnosed with cancer.  After he is back to home about 7 weeks ago from rehab he started to take insulin including above-mentioned  diabetes regimen.  He has not been checking blood sugar at home lately.  Hemoglobin A1c improved to 7.3% today.  No other complaints today.  REVIEW OF SYSTEMS As per history of present illness.   PAST MEDICAL HISTORY: Past Medical History:  Diagnosis Date   AAA (abdominal aortic aneurysm) (HCC)    Diabetes mellitus    Hyperlipidemia    Hypertension    Pancreatitis     PAST SURGICAL HISTORY: Past Surgical History:  Procedure Laterality Date   ABDOMINAL AORTIC ANEURYSM REPAIR  02-04-2009   EVAR   ABDOMINAL AORTOGRAM W/LOWER EXTREMITY N/A 03/25/2019   Procedure: ABDOMINAL AORTOGRAM W/LOWER  EXTREMITY;  Surgeon: Margherita Shell, MD;  Location: [facility];  Service: Cardiovascular;  Laterality: N/A;   CHOLECYSTECTOMY  Sept. 2008   PERIPHERAL VASCULAR ATHERECTOMY Left 03/25/2019   Procedure: PERIPHERAL VASCULAR ATHERECTOMY;  Surgeon: Margherita Shell, MD;  Location: [facility];  Service: Cardiovascular;  Laterality: Left;  superficiacl femoral   PERIPHERAL VASCULAR INTERVENTION Left 03/25/2019   Procedure: PERIPHERAL VASCULAR INTERVENTION;  Surgeon: Margherita Shell, MD;  Location: [facility];  Service: Cardiovascular;  Laterality: Left;  superficial femoral    ALLERGIES: Allergies  Allergen Reactions   Sulfa Antibiotics Nausea Only   Cephalexin Diarrhea    FAMILY HISTORY:  Family History  Problem Relation Age of Onset   Heart disease Mother    Diabetes Father    Cancer Sister     SOCIAL HISTORY: Social History   Socioeconomic History   Marital status: Married    Spouse name: Not on file   Number of children: Not on file   Years of education: Not on file   Highest education level: Not on file  Occupational History   Not on file  Tobacco Use   Smoking status: Every Day    Current packs/day: 1.00    Average packs/day: 1 pack/day for 20.0 years (20.0 ttl pk-yrs)    Types: Cigars, Cigarettes    Passive exposure: Never   Smokeless tobacco: Never   Tobacco comments:    avg 4-5 per day  Vaping Use   Vaping status: Never Used  Substance and Sexual Activity   Alcohol use: Yes    Alcohol/week: 1.0 standard drink of alcohol    Types: 1 Glasses of wine per week   Drug use: No   Sexual activity: Not on file  Other Topics Concern   Not on file  Social History Narrative   Not on file   Social Drivers of Health   Financial Resource Strain: Low Risk  (06/01/2023)   Received from [facility]   Overall Financial Resource Strain (CARDIA)    Difficulty of Paying Living Expenses: Not hard at all  Food Insecurity: No Food Insecurity  (06/01/2023)   [facility]   Hunger Vital Sign    Worried About Running Out of Food in the Last Year: Never true    Ran Out of Food in the Last Year: Never true  Transportation Needs: No Transportation Needs (06/01/2023)   Received from [facility] - Transportation    Lack of Transportation (Medical): No    Lack of Transportation (Non-Medical): No  Physical Activity: Inactive (06/01/2023)   Received from [facility]   Exercise Vital Sign    Days of Exercise per Week: 0 days    Minutes of Exercise per Session: 0 min  Stress: No Stress Concern Present (06/01/2023)  Received from [facility] - Occupational Stress Questionnaire    Feeling of Stress : Not at all  Social Connections: Socially Integrated (06/01/2023)   Received from [facility]   Social Connection and Isolation Panel [NHANES]    Frequency of Communication with Friends and Family: Once a week    Frequency of Social Gatherings with Friends and Family: More than three times a week    Attends Religious Services: More than 4 times per year    Active Member of [employer] or Organizations: Yes    Attends Engineer, structural: More than 4 times per year    Marital Status: Married    MEDICATIONS:  Current Outpatient Medications  Medication Sig Dispense Refill   acetaminophen  (TYLENOL ) 500 MG tablet Take 1,000 mg by mouth every 6 (six) hours as needed for moderate pain or headache.     amLODipine (NORVASC) 10 MG tablet Take 10 mg by mouth at bedtime.      aspirin  81 MG tablet Take 81 mg by mouth at bedtime.      atorvastatin (LIPITOR) 40 MG tablet Take 40 mg by mouth at bedtime.      chlorpheniramine (CHLOR-TRIMETON) 4 MG tablet Take 4 mg by mouth at bedtime.     clopidogrel  (PLAVIX ) 75 MG tablet Take 1 tablet by mouth once daily 30 tablet 0   gabapentin (NEURONTIN) 600 MG tablet Take 600 mg by mouth at bedtime.      losartan (COZAAR) 50 MG  tablet Take 50 mg by mouth at bedtime.      Continuous Glucose Receiver (DEXCOM G7 RECEIVER) DEVI 1 Device by Does not apply route continuous. (Patient not taking: Reported on 09/04/2023) 1 each 0   Continuous Glucose Sensor (DEXCOM G7 SENSOR) MISC 1 Device by Does not apply route continuous. (Patient not taking: Reported on 09/04/2023) 9 each 0   dapagliflozin propanediol (FARXIGA) 10 MG TABS tablet Take 1 tablet (10 mg total) by mouth at bedtime. 90 tablet 3   GLIPIZIDE XL 10 MG 24 hr tablet Take 1 tablet (10 mg total) by mouth at bedtime. 90 tablet 3   insulin glargine, 1 Unit Dial, (TOUJEO SOLOSTAR) 300 UNIT/ML Solostar Pen Inject 70 Units into the skin at bedtime. 50 mL 4   metFORMIN (GLUCOPHAGE-XR) 500 MG 24 hr tablet Take 2 tablets (1,000 mg total) by mouth 2 (two) times daily with a meal. 360 tablet 3   pioglitazone  (ACTOS ) 30 MG tablet Take 1 tablet (30 mg total) by mouth daily. 90 tablet 3   No current facility-administered medications for this visit.    PHYSICAL EXAM: Vitals:   09/04/23 1508  BP: 134/60  Pulse: 78  Resp: 20  SpO2: 96%  Weight: 283 lb 9.6 oz (128.6 kg)  Height: 6' (1.829 m)    Body mass index is 38.46 kg/m.  Wt Readings from Last 3 Encounters:  09/04/23 283 lb 9.6 oz (128.6 kg)  03/28/23 266 lb 9.6 oz (120.9 kg)  10/09/22 274 lb (124.3 kg)    General: Well developed, well nourished male in no apparent distress.  HEENT: AT/[place], no external lesions.  Eyes: Conjunctiva clear and no icterus. Neck: Neck supple  Lungs: Respirations not labored Neurologic: Alert, oriented, normal speech Extremities / Skin: Dry.  Psychiatric: Does not appear depressed or anxious  Diabetic Foot Exam - Simple   No data filed     LABS Reviewed Lab Results  Component Value Date   HGBA1C  7.3 (A) 09/04/2023   HGBA1C 10.5 (A) 03/28/2023   No results found for: "FRUCTOSAMINE" No results found for: "CHOL", "HDL", "LDLCALC", "LDLDIRECT", "TRIG", "CHOLHDL" Lab Results   Component Value Date   MICRALBCREAT 1,173 (H) 03/28/2023   Lab Results  Component Value Date   CREATININE 0.91 03/28/2023   No results found for: "GFR"  ASSESSMENT / PLAN  1. Uncontrolled type 2 diabetes mellitus with hyperglycemia (HCC)     Diabetes Mellitus type 2, complicated by diabetic neuropathy / PAD.  - Diabetic status / severity: Uncontrolled.  Improving.  Lab Results  Component Value Date   HGBA1C 7.3 (A) 09/04/2023    - Hemoglobin A1c goal : <6.5%  Discussed about type 2 diabetes mellitus, potential chronic complications including retinopathy/nephropathy and neuropathy.  Discussed about importance of controlling blood sugar.  Discussed in detail about lifestyle modification is the most important to control type 2 diabetes mellitus including diet control and exercise/increased physical activity.  He probably had better diet while in the rehab after left tibial fracture in February and has improvement of diabetes control.  I will avoid GLP-1 receptor agonist due to history of pancreatitis and family history of pancreatic cancer.  - Medications: See below.  I) continue Toujeo 70 units daily.  Advised to monitor blood sugar in the morning fasting and at bedtime.  If the fasting blood sugar is persistently less than 100 advised to decrease Toujeo to 60 units daily. II) continue Farxiga 10 mg daily. III) continue glipizide XL 10 mg daily. IV) continue metformin extended release 1000 mg with meals 2 times a day.  V) continue pioglitazone /Actos  30 mg daily.   - Home glucose testing: At least 2 times a day in the morning fasting and bedtime.  Asked to monitor blood sugar and bring glucometer in the follow-up visit.  Patient was sent prescription for Dexcom G7 in the last visit.  - Discussed/ Gave Hypoglycemia treatment plan.  # Consult : not required at this time.   # Annual urine for microalbuminuria/ creatinine ratio,high microalbuminuria currently, continue  ACE/ARB/losartan.  Will check today with BMP and urine microalbumin creatinine ratio today. Last  Lab Results  Component Value Date   MICRALBCREAT 1,173 (H) 03/28/2023    # Foot check nightly / neuropathy, continue [place] 600 mg at bedtime, managed by PCP.  # Annual dilated diabetic eye exams.  Advised for diabetic eye exam.  He is due for it.  - Diet: Make healthy diabetic food choices, patient had seen dietitian locally in the past. - Life style / activity / exercise: Discussed.   2. Blood pressure  -  BP Readings from Last 1 Encounters:  09/04/23 134/60    - Control is in target.  - No change in current plans.  3. Lipid status / Hyperlipidemia - Last No results found for: "LDLCALC" - Continue atorvastatin 40 mg daily, managed by PCP.  Diagnoses and all orders for this visit:  Uncontrolled type 2 diabetes mellitus with hyperglycemia (HCC) -     POCT glycosylated hemoglobin (Hb A1C) -     Microalbumin / creatinine urine ratio -     Basic Metabolic Panel Without GFR -     dapagliflozin propanediol (FARXIGA) 10 MG TABS tablet; Take 1 tablet (10 mg total) by mouth at bedtime. -     GLIPIZIDE XL 10 MG 24 hr tablet; Take 1 tablet (10 mg total) by mouth at bedtime. -     insulin glargine, 1 Unit Dial, (TOUJEO SOLOSTAR) 300 UNIT/ML  Solostar Pen; Inject 70 Units into the skin at bedtime. -     metFORMIN (GLUCOPHAGE-XR) 500 MG 24 hr tablet; Take 2 tablets (1,000 mg total) by mouth 2 (two) times daily with a meal. -     pioglitazone  (ACTOS ) 30 MG tablet; Take 1 tablet (30 mg total) by mouth daily.   DISPOSITION Follow up in clinic in 3  months suggested.   All questions answered and patient verbalized understanding of the plan.  George Onis Markoff, MD [facility] [address] [place], [address] [place], [place] [address] Phone # [phone]  At least part of this note was generated using voice recognition software. Inadvertent word errors may have  occurred, which were not recognized during the proofreading process.

## 2023-09-04 NOTE — Patient Instructions (Signed)
 Latest Reference Range & Units 03/28/23 15:00 09/04/23 15:11  Hemoglobin A1C 4.0 - 5.6 % -  10.5 ! Pend 7.3 ! Pend  !: Data is abnormal

## 2023-09-05 ENCOUNTER — Ambulatory Visit: Payer: PRIVATE HEALTH INSURANCE | Admitting: Endocrinology

## 2023-09-05 LAB — BASIC METABOLIC PANEL WITHOUT GFR
BUN: 22 mg/dL (ref 7–25)
CO2: 25 mmol/L (ref 20–32)
Calcium: 9.3 mg/dL (ref 8.6–10.3)
Chloride: 102 mmol/L (ref 98–110)
Creat: 1.03 mg/dL (ref 0.70–1.35)
Glucose, Bld: 119 mg/dL — ABNORMAL HIGH (ref 65–99)
Potassium: 4.2 mmol/L (ref 3.5–5.3)
Sodium: 135 mmol/L (ref 135–146)

## 2023-09-05 LAB — MICROALBUMIN / CREATININE URINE RATIO
Creatinine, Urine: 135 mg/dL (ref 20–320)
Microalb Creat Ratio: 719 mg/g{creat} — ABNORMAL HIGH (ref <30)
Microalb, Ur: 97.1 mg/dL

## 2023-09-06 ENCOUNTER — Other Ambulatory Visit (HOSPITAL_COMMUNITY): Payer: Self-pay

## 2023-09-13 ENCOUNTER — Other Ambulatory Visit: Payer: Self-pay

## 2023-09-13 MED ORDER — CLOPIDOGREL BISULFATE 75 MG PO TABS: 75.0000 mg | ORAL_TABLET | Freq: Every day | ORAL | 0 refills | Status: DC

## 2023-10-01 ENCOUNTER — Ambulatory Visit (HOSPITAL_COMMUNITY)
Admission: RE | Admit: 2023-10-01 | Discharge: 2023-10-01 | Disposition: A | Discharge status: Home / Self Care | Payer: Medicare Other | Source: Ambulatory Visit | Attending: Surgery | Admitting: Surgery

## 2023-10-01 ENCOUNTER — Encounter: Payer: Self-pay | Admitting: Surgery

## 2023-10-01 ENCOUNTER — Other Ambulatory Visit: Payer: Self-pay | Admitting: Surgery

## 2023-10-01 ENCOUNTER — Telehealth: Payer: Self-pay

## 2023-10-01 ENCOUNTER — Ambulatory Visit (INDEPENDENT_AMBULATORY_CARE_PROVIDER_SITE_OTHER): Payer: PRIVATE HEALTH INSURANCE | Attending: Surgery | Admitting: Surgery

## 2023-10-01 VITALS — BP 146/80 | HR 62 | Temp 97.9°F | Resp 20 | Ht 72.0 in | Wt 286.0 lb

## 2023-10-01 DIAGNOSIS — I70213 Atherosclerosis of native arteries of extremities with intermittent claudication, bilateral legs: Secondary | ICD-10-CM | POA: Diagnosis present

## 2023-10-01 DIAGNOSIS — I7143 Infrarenal abdominal aortic aneurysm, without rupture: Secondary | ICD-10-CM | POA: Insufficient documentation

## 2023-10-01 LAB — VAS US ABI WITH/WO TBI
Left ABI: 0.7
Right ABI: 0.66

## 2023-10-01 MED ORDER — CLOPIDOGREL BISULFATE 75 MG PO TABS: 75.0000 mg | ORAL_TABLET | Freq: Every day | ORAL | 11 refills | Status: AC

## 2023-10-01 NOTE — Telephone Encounter (Signed)
 Patient wishes to call this office when ready to schedule angiogram.

## 2023-10-01 NOTE — Progress Notes (Signed)
 [facility] [place]  Patient name: George Webb MRN: [id number] DOB: 02-08-1956 Sex: male   REASON FOR VISIT:    Follow-up  HISOTRY OF PRESENT ILLNESS:    George Webb is a 68 y.o. male who is status post endovascular repair of a 5.5 cm abdominal aortic aneurysm in October 2010.   He was seen in the [facility] clinic in March 2024  There was no change in his ABIs.  The right was 0.64 on the left with 0.94.  There was no evidence of in-stent stenosis on the left however there was mild stenosis in the native SFA proximal to the stent.  He was found to have a maximum aortic diameter of 5.57 cm without evidence of endoleak.  This was an increase from 3.8 cm in 2021.  Therefore he was sent for a CT scan to better evaluate this.  On CT scan the maximum diameter is 3.4 cm.  There is no endoleak.  However, the scan also showed an occluded right common femoral artery he also has a stenosis within the left superficial femoral artery, proximal to this left SFA stent.  He is back for follow-up.  He has no new symptoms.  His daily routine is not impacted by leg pain.  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  Mechanical thrombectomy with the jetstream device was performed and a 7 x 100 Eluvia stent was placed.  An June 2024, he had stenosis in his native left superficial femoral artery proximal to his stents with a velocity of 282 cm/s.  He was not having any symptoms.  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  He is medically managed for hypertension.  He is on a statin for hypercholesterolemia.  He is a smoker.  He fell recently and broke his leg and  has been in rehab.  He is also dealing with his daughter who is being [facility] for cancer [facility]   PAST MEDICAL HISTORY:   Past Medical History:  Diagnosis Date   AAA (abdominal aortic aneurysm) (HCC)    Diabetes mellitus    Hyperlipidemia    Hypertension    Pancreatitis      FAMILY HISTORY:   Family History  Problem Relation Age of Onset   Heart disease Mother    Diabetes Father    Cancer Sister     SOCIAL HISTORY:   Social History   Tobacco Use   Smoking status: Every Day    Current packs/day: 1.00    Average packs/day: 1 pack/day for 20.0 years (20.0 ttl pk-yrs)    Types: Cigars, Cigarettes    Passive exposure: Never   Smokeless tobacco: Never   Tobacco comments:    avg 4-5 per day  Substance Use Topics   Alcohol use: Yes    Alcohol/week: 1.0 standard drink of alcohol    Types: 1 Glasses of wine per week     ALLERGIES:   Allergies  Allergen Reactions   Sulfa Antibiotics Nausea Only   Cephalexin Diarrhea     CURRENT MEDICATIONS:   Current Outpatient Medications  Medication Sig Dispense Refill   acetaminophen  (TYLENOL ) 500 MG tablet Take 1,000  mg by mouth every 6 (six) hours as needed for moderate pain or headache.     amLODipine (NORVASC) 10 MG tablet Take 10 mg by mouth at bedtime.      aspirin  81 MG tablet Take 81 mg by mouth at bedtime.      atorvastatin (LIPITOR) 40 MG tablet Take 40 mg by mouth at bedtime.      chlorpheniramine (CHLOR-TRIMETON) 4 MG tablet Take 4 mg by mouth at bedtime.     clopidogrel  (PLAVIX ) 75 MG tablet Take 1 tablet (75 mg total) by mouth daily. 30 tablet 0   Continuous Glucose Receiver (DEXCOM G7 RECEIVER) DEVI 1 Device by Does not apply route continuous. (Patient not taking: Reported on 09/04/2023) 1 each 0   Continuous Glucose Sensor (DEXCOM G7 SENSOR) MISC 1 Device by Does not apply route continuous. (Patient not taking: Reported on 09/04/2023) 9 each 0   dapagliflozin  propanediol (FARXIGA ) 10 MG TABS tablet Take 1  tablet (10 mg total) by mouth at bedtime. 90 tablet 3   gabapentin (NEURONTIN) 600 MG tablet Take 600 mg by mouth at bedtime.      GLIPIZIDE  XL 10 MG 24 hr tablet Take 1 tablet (10 mg total) by mouth at bedtime. 90 tablet 3   insulin glargine , 1 Unit Dial, (TOUJEO  SOLOSTAR) 300 UNIT/ML Solostar Pen Inject 70 Units into the skin at bedtime. 50 mL 4   losartan (COZAAR) 50 MG tablet Take 50 mg by mouth at bedtime.      metFORMIN  (GLUCOPHAGE -XR) 500 MG 24 hr tablet Take 2 tablets (1,000 mg total) by mouth 2 (two) times daily with a meal. 360 tablet 3   pioglitazone  (ACTOS ) 30 MG tablet Take 1 tablet (30 mg total) by mouth daily. 90 tablet 3   No current facility-administered medications for this visit.    REVIEW OF SYSTEMS:   [X]  denotes positive finding, [ ]  denotes negative finding Cardiac  Comments:  Chest pain or chest pressure:    Shortness of breath upon exertion:    Short of breath when lying flat:    Irregular heart rhythm:        Vascular    Pain in calf, thigh, or hip brought on by ambulation:    Pain in feet at night that wakes you up from your sleep:     Blood clot in your veins:    Leg swelling:         Pulmonary    Oxygen at home:    Productive cough:     Wheezing:         Neurologic    Sudden weakness in arms or legs:     Sudden numbness in arms or legs:     Sudden onset of difficulty speaking or slurred speech:    Temporary loss of vision in one eye:     Problems with dizziness:         Gastrointestinal    Blood in stool:     Vomited blood:         Genitourinary    Burning when urinating:     Blood in urine:        Psychiatric    Major depression:         Hematologic    Bleeding problems:    Problems with blood clotting too easily:        Skin    Rashes or ulcers:        Constitutional    Fever or chills:  PHYSICAL EXAM:   There were no vitals filed for this visit.  GENERAL: The patient is a well-nourished male, in no acute distress. The  vital signs are documented above. CARDIAC: There is a regular rate and rhythm.  VASCULAR: Nonpalpable pedal pulses PULMONARY: Non-labored respirations ABDOMEN: Soft and non-tender MUSCULOSKELETAL: There are no major deformities or cyanosis. NEUROLOGIC: No focal weakness or paresthesias are detected. SKIN: There are no ulcers or rashes noted. PSYCHIATRIC: The patient has a normal affect.  STUDIES:   I have reviewed the following:  ABI/TBIToday's ABIToday's TBIPrevious ABIPrevious TBI  +-------+-----------+-----------+------------+------------+  Right 0.66       0.51       0.62        0.54          +-------+-----------+-----------+------------+------------+  Left  0.70       0.57       0.86        0.66          +-------+-----------+-----------+------------+----------     Left Stent(s):  +---------------+--------+---------------+----------+--------+  SFA           PSV cm/sStenosis       Waveform  Comments  +---------------+--------+---------------+----------+--------+  Prox to Stent  52                     monophasic          +---------------+--------+---------------+----------+--------+  Proximal Stent 47                     monophasic          +---------------+--------+---------------+----------+--------+  Mid Stent      41                     monophasic          +---------------+--------+---------------+----------+--------+  Distal Stent   372     50-99% stenosis          NWV       +---------------+--------+---------------+----------+--------+  Distal to Stent397     50-99% stenosis                    +---------------+--------+---------------+----------+--------+  Summary:  Left: Patent left SFA stent with increased velocity in the distal stent  and outflow to stent in the 50 - 99% stenosis range.   EVAR: Abdominal Aorta: The largest aortic diameter has decreased compared to  prior duplex exam. Previous diameter  measurement was 4.2 cm. Prior CT  revealed a 3.4 cm sac.  MEDICAL ISSUES:   AAA: Ultrasound shows no evidence of endoleak and smaller diameter at 3.4 cm.  This will need to be repeated in 1 year  Lower extremity: The patient has developed progressive stenosis within the stents in his left SFA.  The velocities are approaching 400 cm/s and so I feel that this needs to be addressed in order to prevent occlusion of his existing stent..  In addition he has native SFA disease that may also require treatment.  This would need to be through an antegrade approach because of his EVAR.  We will work on getting this coordinated in the near future.  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  He knows to let me know if he develops a nonhealing wound on the right foot.    Marti Slates, MD, FACS [facility] [phone] Pager [phone]

## 2023-10-01 NOTE — H&P (View-Only) (Signed)
 [facility] [place]  Patient name: George Webb MRN: [id number] DOB: 02-08-1956 Sex: male   REASON FOR VISIT:    Follow-up  HISOTRY OF PRESENT ILLNESS:    George Webb is a 68 y.o. male who is status post endovascular repair of a 5.5 cm abdominal aortic aneurysm in October 2010.   He was seen in the [facility] clinic in March 2024  There was no change in his ABIs.  The right was 0.64 on the left with 0.94.  There was no evidence of in-stent stenosis on the left however there was mild stenosis in the native SFA proximal to the stent.  He was found to have a maximum aortic diameter of 5.57 cm without evidence of endoleak.  This was an increase from 3.8 cm in 2021.  Therefore he was sent for a CT scan to better evaluate this.  On CT scan the maximum diameter is 3.4 cm.  There is no endoleak.  However, the scan also showed an occluded right common femoral artery he also has a stenosis within the left superficial femoral artery, proximal to this left SFA stent.  He is back for follow-up.  He has no new symptoms.  His daily routine is not impacted by leg pain.  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  Mechanical thrombectomy with the jetstream device was performed and a 7 x 100 Eluvia stent was placed.  An June 2024, he had stenosis in his native left superficial femoral artery proximal to his stents with a velocity of 282 cm/s.  He was not having any symptoms.  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  He is medically managed for hypertension.  He is on a statin for hypercholesterolemia.  He is a smoker.  He fell recently and broke his leg and  has been in rehab.  He is also dealing with his daughter who is being treated for cancer at [facility]   PAST MEDICAL HISTORY:   Past Medical History:  Diagnosis Date   AAA (abdominal aortic aneurysm) (HCC)    Diabetes mellitus    Hyperlipidemia    Hypertension    Pancreatitis      FAMILY HISTORY:   Family History  Problem Relation Age of Onset   Heart disease Mother    Diabetes Father    Cancer Sister     SOCIAL HISTORY:   Social History   Tobacco Use   Smoking status: Every Day    Current packs/day: 1.00    Average packs/day: 1 pack/day for 20.0 years (20.0 ttl pk-yrs)    Types: Cigars, Cigarettes    Passive exposure: Never   Smokeless tobacco: Never   Tobacco comments:    avg 4-5 per day  Substance Use Topics   Alcohol use: Yes    Alcohol/week: 1.0 standard drink of alcohol    Types: 1 Glasses of wine per week     ALLERGIES:   Allergies  Allergen Reactions   Sulfa Antibiotics Nausea Only   Cephalexin Diarrhea     CURRENT MEDICATIONS:   Current Outpatient Medications  Medication Sig Dispense Refill   acetaminophen  (TYLENOL ) 500 MG tablet Take 1,000  mg by mouth every 6 (six) hours as needed for moderate pain or headache.     amLODipine (NORVASC) 10 MG tablet Take 10 mg by mouth at bedtime.      aspirin  81 MG tablet Take 81 mg by mouth at bedtime.      atorvastatin (LIPITOR) 40 MG tablet Take 40 mg by mouth at bedtime.      chlorpheniramine (CHLOR-TRIMETON) 4 MG tablet Take 4 mg by mouth at bedtime.     clopidogrel  (PLAVIX ) 75 MG tablet Take 1 tablet (75 mg total) by mouth daily. 30 tablet 0   Continuous Glucose Receiver (DEXCOM G7 RECEIVER) DEVI 1 Device by Does not apply route continuous. (Patient not taking: Reported on 09/04/2023) 1 each 0   Continuous Glucose Sensor (DEXCOM G7 SENSOR) MISC 1 Device by Does not apply route continuous. (Patient not taking: Reported on 09/04/2023) 9 each 0   dapagliflozin  propanediol (FARXIGA ) 10 MG TABS tablet Take 1  tablet (10 mg total) by mouth at bedtime. 90 tablet 3   gabapentin (NEURONTIN) 600 MG tablet Take 600 mg by mouth at bedtime.      GLIPIZIDE  XL 10 MG 24 hr tablet Take 1 tablet (10 mg total) by mouth at bedtime. 90 tablet 3   insulin glargine , 1 Unit Dial, (TOUJEO  SOLOSTAR) 300 UNIT/ML Solostar Pen Inject 70 Units into the skin at bedtime. 50 mL 4   losartan (COZAAR) 50 MG tablet Take 50 mg by mouth at bedtime.      metFORMIN  (GLUCOPHAGE -XR) 500 MG 24 hr tablet Take 2 tablets (1,000 mg total) by mouth 2 (two) times daily with a meal. 360 tablet 3   pioglitazone  (ACTOS ) 30 MG tablet Take 1 tablet (30 mg total) by mouth daily. 90 tablet 3   No current facility-administered medications for this visit.    REVIEW OF SYSTEMS:   [X]  denotes positive finding, [ ]  denotes negative finding Cardiac  Comments:  Chest pain or chest pressure:    Shortness of breath upon exertion:    Short of breath when lying flat:    Irregular heart rhythm:        Vascular    Pain in calf, thigh, or hip brought on by ambulation:    Pain in feet at night that wakes you up from your sleep:     Blood clot in your veins:    Leg swelling:         Pulmonary    Oxygen at home:    Productive cough:     Wheezing:         Neurologic    Sudden weakness in arms or legs:     Sudden numbness in arms or legs:     Sudden onset of difficulty speaking or slurred speech:    Temporary loss of vision in one eye:     Problems with dizziness:         Gastrointestinal    Blood in stool:     Vomited blood:         Genitourinary    Burning when urinating:     Blood in urine:        Psychiatric    Major depression:         Hematologic    Bleeding problems:    Problems with blood clotting too easily:        Skin    Rashes or ulcers:        Constitutional    Fever or chills:  PHYSICAL EXAM:   There were no vitals filed for this visit.  GENERAL: The patient is a well-nourished male, in no acute distress. The  vital signs are documented above. CARDIAC: There is a regular rate and rhythm.  VASCULAR: Nonpalpable pedal pulses PULMONARY: Non-labored respirations ABDOMEN: Soft and non-tender MUSCULOSKELETAL: There are no major deformities or cyanosis. NEUROLOGIC: No focal weakness or paresthesias are detected. SKIN: There are no ulcers or rashes noted. PSYCHIATRIC: The patient has a normal affect.  STUDIES:   I have reviewed the following:  ABI/TBIToday's ABIToday's TBIPrevious ABIPrevious TBI  +-------+-----------+-----------+------------+------------+  Right 0.66       0.51       0.62        0.54          +-------+-----------+-----------+------------+------------+  Left  0.70       0.57       0.86        0.66          +-------+-----------+-----------+------------+----------     Left Stent(s):  +---------------+--------+---------------+----------+--------+  SFA           PSV cm/sStenosis       Waveform  Comments  +---------------+--------+---------------+----------+--------+  Prox to Stent  52                     monophasic          +---------------+--------+---------------+----------+--------+  Proximal Stent 47                     monophasic          +---------------+--------+---------------+----------+--------+  Mid Stent      41                     monophasic          +---------------+--------+---------------+----------+--------+  Distal Stent   372     50-99% stenosis          NWV       +---------------+--------+---------------+----------+--------+  Distal to Stent397     50-99% stenosis                    +---------------+--------+---------------+----------+--------+  Summary:  Left: Patent left SFA stent with increased velocity in the distal stent  and outflow to stent in the 50 - 99% stenosis range.   EVAR: Abdominal Aorta: The largest aortic diameter has decreased compared to  prior duplex exam. Previous diameter  measurement was 4.2 cm. Prior CT  revealed a 3.4 cm sac.  MEDICAL ISSUES:   AAA: Ultrasound shows no evidence of endoleak and smaller diameter at 3.4 cm.  This will need to be repeated in 1 year  Lower extremity: The patient has developed progressive stenosis within the stents in his left SFA.  The velocities are approaching 400 cm/s and so I feel that this needs to be addressed in order to prevent occlusion of his existing stent..  In addition he has native SFA disease that may also require treatment.  This would need to be through an antegrade approach because of his EVAR.  We will work on getting this coordinated in the near future.  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  He knows to let me know if he develops a nonhealing wound on the right foot.    Marti Slates, MD, FACS [facility] [phone]  [phone] Pager [phone]

## 2023-10-04 ENCOUNTER — Other Ambulatory Visit: Payer: Self-pay

## 2023-10-04 DIAGNOSIS — I70213 Atherosclerosis of native arteries of extremities with intermittent claudication, bilateral legs: Secondary | ICD-10-CM

## 2023-10-23 ENCOUNTER — Other Ambulatory Visit: Payer: Self-pay

## 2023-10-23 ENCOUNTER — Encounter (HOSPITAL_COMMUNITY)
Admission: RE | Disposition: A | Discharge status: Home / Self Care | Payer: Self-pay | Source: Home / Self Care | Attending: Surgery

## 2023-10-23 ENCOUNTER — Ambulatory Visit (HOSPITAL_COMMUNITY)
Admission: RE | Admit: 2023-10-23 | Discharge: 2023-10-23 | Disposition: A | Discharge status: Home / Self Care | Payer: PRIVATE HEALTH INSURANCE | Attending: Surgery | Admitting: Surgery

## 2023-10-23 DIAGNOSIS — F17210 Nicotine dependence, cigarettes, uncomplicated: Secondary | ICD-10-CM | POA: Insufficient documentation

## 2023-10-23 DIAGNOSIS — T82856A Stenosis of peripheral vascular stent, initial encounter: Secondary | ICD-10-CM

## 2023-10-23 DIAGNOSIS — Z8679 Personal history of other diseases of the circulatory system: Secondary | ICD-10-CM | POA: Diagnosis not present

## 2023-10-23 DIAGNOSIS — Z9889 Other specified postprocedural states: Secondary | ICD-10-CM | POA: Diagnosis not present

## 2023-10-23 DIAGNOSIS — E1151 Type 2 diabetes mellitus with diabetic peripheral angiopathy without gangrene: Secondary | ICD-10-CM | POA: Insufficient documentation

## 2023-10-23 DIAGNOSIS — Z794 Long term (current) use of insulin: Secondary | ICD-10-CM | POA: Diagnosis not present

## 2023-10-23 DIAGNOSIS — Z7984 Long term (current) use of oral hypoglycemic drugs: Secondary | ICD-10-CM | POA: Diagnosis not present

## 2023-10-23 DIAGNOSIS — I70213 Atherosclerosis of native arteries of extremities with intermittent claudication, bilateral legs: Secondary | ICD-10-CM

## 2023-10-23 DIAGNOSIS — I10 Essential (primary) hypertension: Secondary | ICD-10-CM | POA: Diagnosis not present

## 2023-10-23 HISTORY — PX: ABDOMINAL AORTOGRAM: CATH118222

## 2023-10-23 HISTORY — PX: LOWER EXTREMITY INTERVENTION: CATH118252

## 2023-10-23 HISTORY — PX: LOWER EXTREMITY ANGIOGRAPHY: CATH118251

## 2023-10-23 LAB — GLUCOSE, CAPILLARY: Glucose-Capillary: 135 mg/dL — ABNORMAL HIGH (ref 70–99)

## 2023-10-23 LAB — POCT I-STAT, CHEM 8
BUN: 24 mg/dL — ABNORMAL HIGH (ref 8–23)
Calcium, Ion: 1.3 mmol/L (ref 1.15–1.40)
Chloride: 105 mmol/L (ref 98–111)
Creatinine, Ser: 1.1 mg/dL (ref 0.61–1.24)
Glucose, Bld: 140 mg/dL — ABNORMAL HIGH (ref 70–99)
HCT: 40 % (ref 39.0–52.0)
Hemoglobin: 13.6 g/dL (ref 13.0–17.0)
Potassium: 4.2 mmol/L (ref 3.5–5.1)
Sodium: 140 mmol/L (ref 135–145)
TCO2: 23 mmol/L (ref 22–32)

## 2023-10-23 SURGERY — ABDOMINAL AORTOGRAM
Anesthesia: LOCAL

## 2023-10-23 MED ORDER — SODIUM CHLORIDE 0.9 % WEIGHT BASED INFUSION: 1.0000 mL/kg/h | INTRAVENOUS | Status: DC

## 2023-10-23 MED ORDER — FENTANYL CITRATE (PF) 100 MCG/2ML IJ SOLN
INTRAMUSCULAR | Status: AC
  Filled 2023-10-23: qty 2

## 2023-10-23 MED ORDER — HYDRALAZINE HCL 20 MG/ML IJ SOLN: 5.0000 mg | INTRAMUSCULAR | Status: DC | PRN

## 2023-10-23 MED ORDER — MIDAZOLAM HCL 2 MG/2ML IJ SOLN
INTRAMUSCULAR | Status: DC | PRN
Start: 2023-10-23 — End: 2023-10-23
  Administered 2023-10-23: 1 mg via INTRAVENOUS
  Administered 2023-10-23: 2 mg via INTRAVENOUS
  Administered 2023-10-23: 1 mg via INTRAVENOUS

## 2023-10-23 MED ORDER — HEPARIN (PORCINE) IN NACL 1000-0.9 UT/500ML-% IV SOLN
INTRAVENOUS | Status: DC | PRN
Start: 2023-10-23 — End: 2023-10-23
  Administered 2023-10-23 (×2): 500 mL

## 2023-10-23 MED ORDER — LIDOCAINE HCL (PF) 1 % IJ SOLN
INTRAMUSCULAR | Status: AC
Start: 2023-10-23 — End: 2023-10-23
  Filled 2023-10-23: qty 30

## 2023-10-23 MED ORDER — MORPHINE SULFATE (PF) 2 MG/ML IV SOLN: 2.0000 mg | INTRAVENOUS | Status: DC | PRN

## 2023-10-23 MED ORDER — ONDANSETRON HCL 4 MG/2ML IJ SOLN: 4.0000 mg | Freq: Four times a day (QID) | INTRAMUSCULAR | Status: DC | PRN

## 2023-10-23 MED ORDER — MIDAZOLAM HCL 2 MG/2ML IJ SOLN
INTRAMUSCULAR | Status: AC
  Filled 2023-10-23: qty 2

## 2023-10-23 MED ORDER — CLOPIDOGREL BISULFATE 75 MG PO TABS
75.0000 mg | ORAL_TABLET | Freq: Every day | ORAL | Status: DC
Start: 2023-10-24 — End: 2023-10-23

## 2023-10-23 MED ORDER — SODIUM CHLORIDE 0.9 % IV SOLN: INTRAVENOUS | Status: DC

## 2023-10-23 MED ORDER — HEPARIN SODIUM (PORCINE) 1000 UNIT/ML IJ SOLN
INTRAMUSCULAR | Status: DC | PRN
  Administered 2023-10-23: 10000 [IU] via INTRAVENOUS

## 2023-10-23 MED ORDER — FENTANYL CITRATE (PF) 100 MCG/2ML IJ SOLN
INTRAMUSCULAR | Status: DC | PRN
  Administered 2023-10-23 (×2): 50 ug via INTRAVENOUS
  Administered 2023-10-23: 25 ug via INTRAVENOUS

## 2023-10-23 MED ORDER — OXYCODONE HCL 5 MG PO TABS: 5.0000 mg | ORAL_TABLET | ORAL | Status: DC | PRN

## 2023-10-23 MED ORDER — SODIUM CHLORIDE 0.9% FLUSH: 3.0000 mL | INTRAVENOUS | Status: DC | PRN

## 2023-10-23 MED ORDER — SODIUM CHLORIDE 0.9% FLUSH: 3.0000 mL | Freq: Two times a day (BID) | INTRAVENOUS | Status: DC

## 2023-10-23 MED ORDER — LABETALOL HCL 5 MG/ML IV SOLN: 10.0000 mg | INTRAVENOUS | Status: DC | PRN

## 2023-10-23 MED ORDER — HEPARIN SODIUM (PORCINE) 1000 UNIT/ML IJ SOLN
INTRAMUSCULAR | Status: AC
  Filled 2023-10-23: qty 10

## 2023-10-23 MED ORDER — IODIXANOL 320 MG/ML IV SOLN
INTRAVENOUS | Status: DC | PRN
Start: 2023-10-23 — End: 2023-10-23
  Administered 2023-10-23: 45 mL

## 2023-10-23 MED ORDER — ACETAMINOPHEN 325 MG PO TABS: 650.0000 mg | ORAL_TABLET | ORAL | Status: DC | PRN

## 2023-10-23 MED ORDER — LIDOCAINE HCL (PF) 1 % IJ SOLN
INTRAMUSCULAR | Status: DC | PRN
  Administered 2023-10-23: 10 mL

## 2023-10-23 MED ORDER — ASPIRIN 81 MG PO TBEC: 81.0000 mg | DELAYED_RELEASE_TABLET | Freq: Every day | ORAL | Status: DC

## 2023-10-23 MED ORDER — SODIUM CHLORIDE 0.9 % IV SOLN: 250.0000 mL | INTRAVENOUS | Status: DC | PRN

## 2023-10-23 SURGICAL SUPPLY — 17 items
CATH ANGIO 5F BER2 65CM (CATHETERS) IMPLANT
CATH OMNI FLUSH 5F 65CM (CATHETERS) IMPLANT
COVER DOME SNAP 22 D (MISCELLANEOUS) IMPLANT
DCB RANGER 7.0X100 135 (BALLOONS) IMPLANT
DEVICE VASC CLSR CELT ART 6 (Vascular Products) IMPLANT
GUIDEWIRE NITREX 0.018X80X5 (WIRE) IMPLANT
KIT ENCORE 26 ADVANTAGE (KITS) IMPLANT
KIT MICROPUNCTURE NIT STIFF (SHEATH) IMPLANT
KIT SINGLE USE MANIFOLD (KITS) IMPLANT
MAT PREVALON FULL STRYKER (MISCELLANEOUS) IMPLANT
SET ATX-X65L (MISCELLANEOUS) IMPLANT
SHEATH PINNACLE 5F 10CM (SHEATH) IMPLANT
SHEATH PINNACLE 6F 10CM (SHEATH) IMPLANT
SHEATH PROBE COVER 6X72 (BAG) IMPLANT
TRAY PV CATH (CUSTOM PROCEDURE TRAY) ×2 IMPLANT
WIRE BENTSON .035X145CM (WIRE) IMPLANT
WIRE G V18X300CM (WIRE) IMPLANT

## 2023-10-23 NOTE — Discharge Instructions (Signed)
NO METFORMIN FOR 2 DAYS 

## 2023-10-23 NOTE — Progress Notes (Signed)
 Dr Serene called to ask about  resuming plavix  and aspirin  and per Dr Serene, client may resume plavix  and asa this evening

## 2023-10-23 NOTE — Interval H&P Note (Signed)
 History and Physical Interval Note:  10/23/2023 7:52 AM  George Webb  has presented today for surgery, with the diagnosis of atherosclerosis claudication.  The various methods of treatment have been discussed with the patient and family. After consideration of risks, benefits and other options for treatment, the patient has consented to  Procedure(s): ABDOMINAL AORTOGRAM (N/A) Lower Extremity Angiography (N/A) LOWER EXTREMITY INTERVENTION (N/A) as a surgical intervention.  The patient's history has been reviewed, patient examined, no change in status, stable for surgery.  I have reviewed the patient's chart and labs.  Questions were answered to the patient's satisfaction.     [name]

## 2023-10-23 NOTE — Op Note (Signed)
    Patient name: George Webb MRN: [id number] DOB: 20-May-1955 Sex: male  10/23/2023 Pre-operative Diagnosis: Left SFA in-stent stenosis Post-operative diagnosis:  Same Surgeon:  [name] Procedure Performed:  1.  Ultrasound-guided access (antegrade) left common femoral artery  2.  Left leg angiogram  3.  Drug-coated balloon angioplasty left SFA  4.  Conscious sedation, 49 minutes  5.  Closure device, Celt  Indications: This is a 68 year old gentleman who was found to have recurrent stenosis in his left leg within his previously placed stent.  He comes in today for further evaluation and possible intervention  Procedure:  The patient was identified in the holding area and taken to room 8.  The patient was then placed supine on the table and prepped and draped in the usual sterile fashion.  A time out was called.  Conscious sedation was administered with the use of IV fentanyl  and Versed  under continuous physician and nurse monitoring.  Heart rate, blood pressure, and oxygen saturation were continuously monitored.  Total sedation time was 49 minutes.  Ultrasound was used to evaluate the left common femoral artery.  It was patent .  A digital ultrasound image was acquired.  A micropuncture needle was used to access the left common femoral artery in an antegrade fashion.  A 014 wire was then inserted.  It went out into the profundofemoral artery.  I then placed a micropuncture sheath.  A Bentson wire was then placed followed by a 6 [place] sheath.  I then started to withdraw the sheath over a Berenstein 2 catheter taking selective images to identify the origin of the superficial femoral artery.  Once this was done the wire was navigated into the superficial femoral artery and the sheath was inserted down to its hub and imaging of the left leg was performed.    Findings:   Left Lower Extremity: The left profundofemoral artery visualized portion of the common femoral artery were patent without  significant stenosis.  The superficial femoral artery was patent proximally.  A stent was visualized at the adductor canal with 90% stenosis.  The popliteal artery is widely patent with three-vessel runoff.  The dominant vessel across the ankle is the posterior tibial  Intervention: After the above images were acquired the decision was made to proceed with intervention.  The patient was fully heparinized.  A V-18 wire was advanced across the stenosis and a 7 x 100 drug-coated Ranger balloon was used to perform balloon angioplasty of the in-stent stenosis for 3 minutes.  Completion imaging was performed and showed no residual stenosis.  Catheters and wires were removed.  The groin was closed with a Celt  Impression:  #1  Successful drug-coated balloon angioplasty of a 90% in-stent stenosis within the left superficial femoral artery     V. [name], M.D., [facility] [place] Office: [phone] Pager:  [phone]

## 2023-10-23 NOTE — Progress Notes (Signed)
 Report to vivian rn.

## 2023-10-24 ENCOUNTER — Encounter (HOSPITAL_COMMUNITY): Payer: Self-pay | Admitting: Surgery

## 2023-11-21 ENCOUNTER — Other Ambulatory Visit: Payer: Self-pay

## 2023-11-21 DIAGNOSIS — I70213 Atherosclerosis of native arteries of extremities with intermittent claudication, bilateral legs: Secondary | ICD-10-CM

## 2023-12-12 ENCOUNTER — Ambulatory Visit: Payer: Self-pay | Admitting: Endocrinology

## 2023-12-12 ENCOUNTER — Ambulatory Visit (INDEPENDENT_AMBULATORY_CARE_PROVIDER_SITE_OTHER): Payer: PRIVATE HEALTH INSURANCE | Admitting: Endocrinology

## 2023-12-12 ENCOUNTER — Encounter: Payer: Self-pay | Admitting: Endocrinology

## 2023-12-12 VITALS — BP 138/64 | HR 64 | Resp 20 | Ht 72.0 in | Wt 297.4 lb

## 2023-12-12 DIAGNOSIS — Z794 Long term (current) use of insulin: Secondary | ICD-10-CM | POA: Diagnosis not present

## 2023-12-12 DIAGNOSIS — Z7984 Long term (current) use of oral hypoglycemic drugs: Secondary | ICD-10-CM

## 2023-12-12 DIAGNOSIS — E1165 Type 2 diabetes mellitus with hyperglycemia: Secondary | ICD-10-CM

## 2023-12-12 LAB — POCT GLYCOSYLATED HEMOGLOBIN (HGB A1C): Hemoglobin A1C: 6.8 % — AB (ref 4.0–5.6)

## 2023-12-12 MED ORDER — BLOOD GLUCOSE MONITORING SUPPL DEVI: 1.0000 | Freq: Three times a day (TID) | 0 refills | Status: AC

## 2023-12-12 MED ORDER — LANCET DEVICE MISC: 1.0000 | Freq: Three times a day (TID) | 0 refills | Status: AC

## 2023-12-12 MED ORDER — LANCETS MISC. MISC: 1.0000 | Freq: Three times a day (TID) | 3 refills | Status: AC

## 2023-12-12 MED ORDER — BLOOD GLUCOSE TEST VI STRP: 1.0000 | ORAL_STRIP | Freq: Three times a day (TID) | 3 refills | Status: AC

## 2023-12-12 NOTE — Progress Notes (Signed)
 Outpatient Endocrinology Note George Miryam Mcelhinney, MD  12/12/23  Patient's Name: George Webb    DOB: 1956-03-29    MRN: [id number]                                                    REASON OF VISIT: Follow-up for type 2 diabetes mellitus  REFERRING PROVIDER: Jolee Greig DEL, PA-C  PCP: Jolee Greig DEL, PA-C  HISTORY OF PRESENT ILLNESS:   George Webb is a 68 y.o. old male with past medical history listed below, is here for follow-up for type 2 diabetes mellitus.   Pertinent Diabetes History: Patient was diagnosed with type 2 diabetes mellitus.  Patient has uncontrolled type 2 diabetes mellitus with hemoglobin A1c in the range of 9-10 send report to endocrinology for evaluation and management.  Patient reports he had tried different anti diabetic medications including injectable in the past, does not recall the details name and no records available to review.  Hemoglobin A1c in December 2024 was 10.5%.  He had taken cortisone injection into the back and prednisone prior to the that time.  Ischial visit was in March 28, 2023.  Patient reports had seen dietitian in the past from PCP office.  Previous diabetes education: Yes   Family h/o diabetes mellitus: no.  Denies family history of diabetes mellitus.  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 Younger sister with history of pancreatic cancer.   Chronic Diabetes Complications : Retinopathy: unknown. Last ophthalmology exam was done on Due.  Nephropathy: no, on ACE/ARB / Losartan Peripheral neuropathy: yes, on , has PAD, following with vascular. On gabapentin 600 mg bedtime. Abdominal aortic aneurysm.  Coronary artery disease: no Stroke: no  Relevant comorbidities and cardiovascular risk factors: Obesity: yes Body mass index is 40.33 kg/m.  Hypertension: Yes  Hyperlipidemia : Yes, on statin   Current / Home Diabetic regimen includes:  Toujeo  70 nits daily at bedtime.  Farxiga  10 mg  daily. Glipizide  XL 10 mg at bedtime.  Metformin  XR 2000 mg daily at bedtime.  Actos /pioglitazone  30 mg daily.  Prior diabetic medications: ? Tresiba : sickness Jardiance , switched to farxiga .  Trulicity he had taken in the past caused stomach upset and pain and he felt like he had pancreatitis.  Glycemic data:   He has not been checking blood sugar at home.  No glucose data to review. He has [employer] glucometer.  He does not prefer to have CGM.  Hypoglycemia: Patient has no hypoglycemic episodes. Patient has hypoglycemia awareness.  Factors modifying glucose control: 1.  Diabetic diet assessment: 3 meals a day, snacks in between the meals.  He likes to drink a regular soft drink, potato pasta and bread.  He adds 3 to 4 packets of sugar in the coffee.  2.  Staying active or exercising: No formal exercise.  Limited physical activity at home.  3.  Medication compliance: compliant most of the time.  Interval history  Recently had death of his daughter.  Sorry for his loss.  Daughter was sick for a long time, he reports she has limited physical activity.  Hemoglobin A1c 6.8% today.  Diabetes regimen is reviewed and noted above.  No glucose data to review.  No other complaints today.  He is accompanied by wife in the clinic today.  REVIEW  OF SYSTEMS As per history of present illness.   PAST MEDICAL HISTORY: Past Medical History:  Diagnosis Date   AAA (abdominal aortic aneurysm) (HCC)    Diabetes mellitus    Hyperlipidemia    Hypertension    Pancreatitis     PAST SURGICAL HISTORY: Past Surgical History:  Procedure Laterality Date   ABDOMINAL AORTIC ANEURYSM REPAIR  02-04-2009   EVAR   ABDOMINAL AORTOGRAM N/A 10/23/2023   Procedure: ABDOMINAL AORTOGRAM;  Surgeon: Serene Gaile ORN, MD;  Location: [facility];  Service: Cardiovascular;  Laterality: N/A;   ABDOMINAL AORTOGRAM W/LOWER EXTREMITY N/A 03/25/2019   Procedure: ABDOMINAL AORTOGRAM W/LOWER EXTREMITY;  Surgeon:  Serene Gaile ORN, MD;  Location: [facility];  Service: Cardiovascular;  Laterality: N/A;   CHOLECYSTECTOMY  Sept. 2008   LOWER EXTREMITY ANGIOGRAPHY Left 10/23/2023   Procedure: Lower Extremity Angiography;  Surgeon: Serene Gaile ORN, MD;  Location: [facility];  Service: Cardiovascular;  Laterality: Left;   LOWER EXTREMITY INTERVENTION Left 10/23/2023   Procedure: LOWER EXTREMITY INTERVENTION;  Surgeon: Serene Gaile ORN, MD;  Location: [facility];  Service: Cardiovascular;  Laterality: Left;   PERIPHERAL VASCULAR ATHERECTOMY Left 03/25/2019   Procedure: PERIPHERAL VASCULAR ATHERECTOMY;  Surgeon: Serene Gaile ORN, MD;  Location: [facility];  Service: Cardiovascular;  Laterality: Left;  superficiacl femoral   PERIPHERAL VASCULAR INTERVENTION Left 03/25/2019   Procedure: PERIPHERAL VASCULAR INTERVENTION;  Surgeon: Serene Gaile ORN, MD;  Location: [facility];  Service: Cardiovascular;  Laterality: Left;  superficial femoral    ALLERGIES: Allergies  Allergen Reactions   Sulfa Antibiotics Nausea Only   Cephalexin Diarrhea    FAMILY HISTORY:  Family History  Problem Relation Age of Onset   Heart disease Mother    Diabetes Father    Cancer Sister     SOCIAL HISTORY: Social History   Socioeconomic History   Marital status: Married    Spouse name: Not on file   Number of children: Not on file   Years of education: Not on file   Highest education level: Not on file  Occupational History   Not on file  Tobacco Use   Smoking status: Every Day    Current packs/day: 1.00    Average packs/day: 1 pack/day for 20.0 years (20.0 ttl pk-yrs)    Types: Cigars, Cigarettes    Passive exposure: Never   Smokeless tobacco: Never   Tobacco comments:    avg 4-5 per day  Vaping Use   Vaping status: Never Used  Substance and Sexual Activity   Alcohol use: Yes    Alcohol/week: 1.0 standard drink of alcohol    Types: 1 Glasses of wine per week   Drug use: No    Sexual activity: Not on file  Other Topics Concern   Not on file  Social History Narrative   Not on file   Social Drivers of Health   Financial Resource Strain: Low Risk  (06/01/2023)   [facility] from [facility]   Overall Financial Resource Strain (CARDIA)    Difficulty of Paying Living Expenses: Not hard at all  Food Insecurity: No Food Insecurity (06/01/2023)   Received from [facility]   Hunger Vital Sign    Within the past 12 months, you worried that your food would run out before you got the money to buy more.: Never true    Within the past 12 months, the food you bought just didn't last and you didn't  have money to get more.: Never true  Transportation Needs: No Transportation Needs (06/01/2023)   Received from [facility] - Transportation    Lack of Transportation (Medical): No    Lack of Transportation (Non-Medical): No  Physical Activity: Inactive (06/01/2023)   Received from [facility]   Exercise Vital Sign    On average, how many days per week do you engage in moderate to strenuous exercise (like a brisk walk)?: 0 days    On average, how many minutes do you engage in exercise at this level?: 0 min  Stress: No Stress Concern Present (06/01/2023)   Received from [facility] - Occupational Stress Questionnaire    Feeling of Stress : Not at all  Social Connections: Socially Integrated (06/01/2023)   Received from [facility]   Social Connection and Isolation Panel    In a typical week, how many times do you talk on the phone with family, friends, or neighbors?: Once a week    How often do you get together with friends or relatives?: More than three times a week    How often do you attend church or religious services?: More than 4 times per year    Do you belong to any clubs or organizations such as church groups, unions, fraternal or athletic groups, or school groups?: Yes    How often do you attend  meetings of the clubs or organizations you belong to?: More than 4 times per year    Are you married, widowed, divorced, separated, never married, or living with a partner?: Married    MEDICATIONS:  Current Outpatient Medications  Medication Sig Dispense Refill   acetaminophen  (TYLENOL ) 650 MG CR tablet Take 1,300 mg by mouth every 8 (eight) hours as needed for pain.     amLODipine (NORVASC) 10 MG tablet Take 10 mg by mouth at bedtime.      aspirin  81 MG tablet Take 81 mg by mouth at bedtime.      atorvastatin (LIPITOR) 40 MG tablet Take 40 mg by mouth at bedtime.      Blood Glucose Monitoring Suppl DEVI 1 each by Does not apply route in the morning, at noon, and at bedtime. May substitute to any manufacturer covered by patient's insurance. 1 each 0   chlorpheniramine (CHLOR-TRIMETON) 4 MG tablet Take 4 mg by mouth at bedtime.     clopidogrel  (PLAVIX ) 75 MG tablet Take 1 tablet (75 mg total) by mouth daily. 30 tablet 11   dapagliflozin  propanediol (FARXIGA ) 10 MG TABS tablet Take 1 tablet (10 mg total) by mouth at bedtime. 90 tablet 3   gabapentin (NEURONTIN) 300 MG capsule Take 600 mg by mouth at bedtime.     GLIPIZIDE  XL 10 MG 24 hr tablet Take 1 tablet (10 mg total) by mouth at bedtime. 90 tablet 3   Glucose Blood (BLOOD GLUCOSE TEST STRIPS) STRP 1 each by In Vitro route in the morning, at noon, and at bedtime. May substitute to any manufacturer covered by patient's insurance. 100 each 3   insulin glargine , 1 Unit Dial, (TOUJEO  SOLOSTAR) 300 UNIT/ML Solostar Pen Inject 70 Units into the skin at bedtime. 50 mL 4   Lancet Device MISC 1 each by Does not apply route in the morning, at noon, and at bedtime. May substitute to any manufacturer covered by patient's insurance. 1 each 0   Lancets Misc. MISC 1 each by Does not apply route  in the morning, at noon, and at bedtime. May substitute to any manufacturer covered by patient's insurance. 100 each 3   losartan (COZAAR) 100 MG tablet Take 100 mg by  mouth daily.     metFORMIN  (GLUCOPHAGE -XR) 500 MG 24 hr tablet Take 2 tablets (1,000 mg total) by mouth 2 (two) times daily with a meal. (Patient taking differently: Take 2,000 mg by mouth at bedtime.) 360 tablet 3   metoprolol succinate (TOPROL-XL) 25 MG 24 hr tablet Take 1 tablet by mouth daily.     naproxen sodium (ALEVE) 220 MG tablet Take 440 mg by mouth at bedtime.     pioglitazone  (ACTOS ) 30 MG tablet Take 1 tablet (30 mg total) by mouth daily. 90 tablet 3   risankizumab-rzaa (SKYRIZI) 150 MG/ML SOSY prefilled syringe Inject 150 mg into the skin every 3 (three) months.     Continuous Glucose Receiver (DEXCOM G7 RECEIVER) DEVI 1 Device by Does not apply route continuous. (Patient not taking: Reported on 12/12/2023) 1 each 0   Continuous Glucose Sensor (DEXCOM G7 SENSOR) MISC 1 Device by Does not apply route continuous. (Patient not taking: Reported on 12/12/2023) 9 each 0   No current facility-administered medications for this visit.    PHYSICAL EXAM: Vitals:   12/12/23 1532  BP: 138/64  Pulse: 64  Resp: 20  SpO2: 97%  Weight: 297 lb 6.4 oz (134.9 kg)  Height: 6' (1.829 m)    Body mass index is 40.33 kg/m.  Wt Readings from Last 3 Encounters:  12/12/23 297 lb 6.4 oz (134.9 kg)  10/23/23 280 lb (127 kg)  10/01/23 286 lb (129.7 kg)    General: Well developed, well nourished male in no apparent distress.  HEENT: AT/[place], no external lesions.  Eyes: Conjunctiva clear and no icterus. Neck: Neck supple  Lungs: Respirations not labored Neurologic: Alert, oriented, normal speech Extremities / Skin: Dry.  Psychiatric: Does not appear depressed or anxious  Diabetic Foot Exam - Simple   No data filed     LABS Reviewed Lab Results  Component Value Date   HGBA1C 6.8 (A) 12/12/2023   HGBA1C 7.3 (A) 09/04/2023   HGBA1C 10.5 (A) 03/28/2023   No results found for: FRUCTOSAMINE No results found for: CHOL, HDL, LDLCALC, LDLDIRECT, TRIG, CHOLHDL Lab Results   Component Value Date   MICRALBCREAT 719 (H) 09/04/2023   MICRALBCREAT 1,173 (H) 03/28/2023   Lab Results  Component Value Date   CREATININE 1.10 10/23/2023   No results found for: GFR  ASSESSMENT / PLAN  1. Uncontrolled type 2 diabetes mellitus with hyperglycemia (HCC)     Diabetes Mellitus type 2, complicated by diabetic neuropathy / PAD.  - Diabetic status / severity: fair controlled  Lab Results  Component Value Date   HGBA1C 6.8 (A) 12/12/2023    - Hemoglobin A1c goal : <6.5%  I will avoid GLP-1 receptor agonist due to history of pancreatitis and family history of pancreatic cancer.  - Medications: See below.  No change.  I) continue Toujeo  70 units daily.  Advised to monitor blood sugar in the morning fasting and at bedtime.  If the fasting blood sugar is persistently less than 100 advised to decrease Toujeo  to 60 units daily. II) continue Farxiga  10 mg daily. III) continue glipizide  XL 10 mg daily. IV) continue metformin  extended release 1000 mg with meals 2 times a day.  V) continue pioglitazone /Actos  30 mg daily.   - Home glucose testing: At least 2 times a day in the morning fasting  and bedtime.  Asked to monitor blood sugar and bring glucometer in the follow-up visit.  Sent prescription for new glucometer.  - Discussed/ Gave Hypoglycemia treatment plan.  # Consult : not required at this time.   # Annual urine for microalbuminuria/ creatinine ratio,high microalbuminuria currently, continue ACE/ARB/losartan.  Trending down, will continue to monitor.  Also on SGLT2 inhibitor. Last  Lab Results  Component Value Date   MICRALBCREAT 719 (H) 09/04/2023    # Foot check nightly / neuropathy, continue [place] 600 mg at bedtime, managed by PCP.  # Annual dilated diabetic eye exams.  Advised for diabetic eye exam.  He is due for it.  - Diet: Make healthy diabetic food choices, patient had seen dietitian locally in the past. - Life style / activity / exercise:  Discussed.   2. Blood pressure  -  BP Readings from Last 1 Encounters:  12/12/23 138/64    - Control is in target.  - No change in current plans.  3. Lipid status / Hyperlipidemia - Last No results found for: LDLCALC - Continue atorvastatin 40 mg daily, managed by PCP.  Diagnoses and all orders for this visit:  Uncontrolled type 2 diabetes mellitus with hyperglycemia (HCC) -     POCT glycosylated hemoglobin (Hb A1C) -     Blood Glucose Monitoring Suppl DEVI; 1 each by Does not apply route in the morning, at noon, and at bedtime. May substitute to any manufacturer covered by patient's insurance. -     Glucose Blood (BLOOD GLUCOSE TEST STRIPS) STRP; 1 each by In Vitro route in the morning, at noon, and at bedtime. May substitute to any manufacturer covered by patient's insurance. -     Lancet Device MISC; 1 each by Does not apply route in the morning, at noon, and at bedtime. May substitute to any manufacturer covered by patient's insurance. -     Lancets Misc. MISC; 1 each by Does not apply route in the morning, at noon, and at bedtime. May substitute to any manufacturer covered by patient's insurance.   DISPOSITION Follow up in clinic in 4 months suggested.   All questions answered and patient verbalized understanding of the plan.  George Johncarlo Maalouf, MD [facility] Endocrinology [facility] [address] [place], [address] [place], [place] [address] Phone # [phone]  At least part of this note was generated using voice recognition software. Inadvertent word errors may have occurred, which were not recognized during the proofreading process.

## 2023-12-14 ENCOUNTER — Other Ambulatory Visit: Payer: Self-pay

## 2023-12-14 DIAGNOSIS — I70213 Atherosclerosis of native arteries of extremities with intermittent claudication, bilateral legs: Secondary | ICD-10-CM

## 2023-12-17 ENCOUNTER — Ambulatory Visit (HOSPITAL_COMMUNITY)
Admission: RE | Admit: 2023-12-17 | Discharge: 2023-12-17 | Disposition: A | Discharge status: Home / Self Care | Payer: PRIVATE HEALTH INSURANCE | Source: Ambulatory Visit | Attending: Surgery | Admitting: Surgery

## 2023-12-17 ENCOUNTER — Ambulatory Visit (INDEPENDENT_AMBULATORY_CARE_PROVIDER_SITE_OTHER): Payer: PRIVATE HEALTH INSURANCE | Admitting: Physician Assistant

## 2023-12-17 ENCOUNTER — Ambulatory Visit (HOSPITAL_BASED_OUTPATIENT_CLINIC_OR_DEPARTMENT_OTHER)
Admission: RE | Admit: 2023-12-17 | Discharge: 2023-12-17 | Disposition: A | Discharge status: Home / Self Care | Payer: PRIVATE HEALTH INSURANCE | Source: Ambulatory Visit | Attending: Surgery | Admitting: Surgery

## 2023-12-17 VITALS — BP 158/74 | HR 68 | Temp 97.7°F | Wt 297.0 lb

## 2023-12-17 DIAGNOSIS — I70213 Atherosclerosis of native arteries of extremities with intermittent claudication, bilateral legs: Secondary | ICD-10-CM | POA: Insufficient documentation

## 2023-12-17 DIAGNOSIS — I739 Peripheral vascular disease, unspecified: Secondary | ICD-10-CM | POA: Insufficient documentation

## 2023-12-17 LAB — VAS US ABI WITH/WO TBI
Left ABI: 0.84
Right ABI: 0.54

## 2023-12-17 NOTE — Progress Notes (Signed)
 Office Note   History of Present Illness   George Webb is a 68 y.o. ([birth year]) male who presents for post angio visit.  He recently underwent left lower extremity angiogram with drug-coated balloon angioplasty of the left SFA on 10/23/2023 by Dr. Serene.  This was done for recurrent stenosis of a previously placed left SFA stent.  He returns today for follow-up.  He says that his left leg feels fine since his intervention.  He denies any rest pain, claudication, or tissue loss.  Overall he is having a hard time physically and mentally.  He started having right hip pain recently and will be evaluated by his orthopedic doctor about this soon.  Him and his wife recently lost one of their daughters in July to cancer.   Current Outpatient Medications  Medication Sig Dispense Refill   acetaminophen  (TYLENOL ) 650 MG CR tablet Take 1,300 mg by mouth every 8 (eight) hours as needed for pain.     amLODipine (NORVASC) 10 MG tablet Take 10 mg by mouth at bedtime.      aspirin  81 MG tablet Take 81 mg by mouth at bedtime.      atorvastatin (LIPITOR) 40 MG tablet Take 40 mg by mouth at bedtime.      Blood Glucose Monitoring Suppl DEVI 1 each by Does not apply route in the morning, at noon, and at bedtime. May substitute to any manufacturer covered by patient's insurance. 1 each 0   chlorpheniramine (CHLOR-TRIMETON) 4 MG tablet Take 4 mg by mouth at bedtime.     clopidogrel  (PLAVIX ) 75 MG tablet Take 1 tablet (75 mg total) by mouth daily. 30 tablet 11   Continuous Glucose Receiver (DEXCOM G7 RECEIVER) DEVI 1 Device by Does not apply route continuous. (Patient not taking: Reported on 12/12/2023) 1 each 0   Continuous Glucose Sensor (DEXCOM G7 SENSOR) MISC 1 Device by Does not apply route continuous. (Patient not taking: Reported on 12/12/2023) 9 each 0   dapagliflozin  propanediol (FARXIGA ) 10 MG TABS tablet Take 1 tablet (10 mg total) by mouth at bedtime. 90 tablet 3   gabapentin (NEURONTIN) 300 MG  capsule Take 600 mg by mouth at bedtime.     GLIPIZIDE  XL 10 MG 24 hr tablet Take 1 tablet (10 mg total) by mouth at bedtime. 90 tablet 3   Glucose Blood (BLOOD GLUCOSE TEST STRIPS) STRP 1 each by In Vitro route in the morning, at noon, and at bedtime. May substitute to any manufacturer covered by patient's insurance. 100 each 3   insulin glargine , 1 Unit Dial, (TOUJEO  SOLOSTAR) 300 UNIT/ML Solostar Pen Inject 70 Units into the skin at bedtime. 50 mL 4   Lancet Device MISC 1 each by Does not apply route in the morning, at noon, and at bedtime. May substitute to any manufacturer covered by patient's insurance. 1 each 0   Lancets Misc. MISC 1 each by Does not apply route in the morning, at noon, and at bedtime. May substitute to any manufacturer covered by patient's insurance. 100 each 3   losartan (COZAAR) 100 MG tablet Take 100 mg by mouth daily.     metFORMIN  (GLUCOPHAGE -XR) 500 MG 24 hr tablet Take 2 tablets (1,000 mg total) by mouth 2 (two) times daily with a meal. (Patient taking differently: Take 2,000 mg by mouth at bedtime.) 360 tablet 3   metoprolol succinate (TOPROL-XL) 25 MG 24 hr tablet Take 1 tablet by mouth daily.     naproxen sodium (ALEVE) 220 MG  tablet Take 440 mg by mouth at bedtime.     pioglitazone  (ACTOS ) 30 MG tablet Take 1 tablet (30 mg total) by mouth daily. 90 tablet 3   risankizumab-rzaa (SKYRIZI) 150 MG/ML SOSY prefilled syringe Inject 150 mg into the skin every 3 (three) months.     No current facility-administered medications for this visit.    REVIEW OF SYSTEMS (negative unless checked):   Cardiac:  []  Chest pain or chest pressure? []  Shortness of breath upon activity? []  Shortness of breath when lying flat? []  Irregular heart rhythm?  Vascular:  []  Pain in calf, thigh, or hip brought on by walking? []  Pain in feet at night that wakes you up from your sleep? []  Blood clot in your veins? []  Leg swelling?  Pulmonary:  []  Oxygen at home? []  Productive  cough? []  Wheezing?  Neurologic:  []  Sudden weakness in arms or legs? []  Sudden numbness in arms or legs? []  Sudden onset of difficult speaking or slurred speech? []  Temporary loss of vision in one eye? []  Problems with dizziness?  Gastrointestinal:  []  Blood in stool? []  Vomited blood?  Genitourinary:  []  Burning when urinating? []  Blood in urine?  Psychiatric:  []  Major depression  Hematologic:  []  Bleeding problems? []  Problems with blood clotting?  Dermatologic:  []  Rashes or ulcers?  Constitutional:  []  Fever or chills?  Ear/Nose/Throat:  []  Change in hearing? []  Nose bleeds? []  Sore throat?  Musculoskeletal:  []  Back pain? [x]  Joint pain? []  Muscle pain?   Physical Examination   Vitals:   12/17/23 1349  BP: (!) 158/74  Pulse: 68  Temp: 97.7 F (36.5 C)  TempSrc: Temporal  Weight: 297 lb (134.7 kg)   Body mass index is 40.28 kg/m.  General:  WDWN in NAD; vital signs documented above Gait: Not observed HENT: WNL, normocephalic Pulmonary: normal non-labored breathing , without rales, rhonchi,  wheezing Cardiac: Regular Abdomen: soft, NT, no masses Skin: without rashes Vascular Exam/Pulses: Palpable femoral pulses bilaterally.  Brisk monophasic DP/PT Doppler signals bilaterally Extremities: without ischemic changes, without gangrene , without cellulitis; without open wounds;  Musculoskeletal: no muscle wasting or atrophy  Neurologic: A&O X 3;  No focal weakness or paresthesias are detected Psychiatric:  The pt has Normal affect.  Non-Invasive Vascular imaging   ABI (12/17/2023) R:  ABI: 0.54 (0.66),  PT: mono DP: mono TBI: 0.39 L:  ABI: 0.84 (0.7),  PT: mono DP: mono TBI: 0.62   LLE Arterial Duplex (12/17/2023) 50 to 74% stenosis of the proximal SFA.  Patent left SFA stent without stenosis  Medical Decision Making   George Webb is a 68 y.o. male who presents for post angio visit  Based on the patient's vascular studies,  his ABIs on the left have improved after his recent left SFA balloon angioplasty.  His left ABIs have increased from 0.7 to 0.84 Left lower extremity arterial duplex demonstrates 50 to 74% stenosis of the proximal SFA.  This does not appear flow-limiting.  His left SFA stent is patent without restenosis He denies any lower extremity claudication, rest pain, or tissue loss.  He has brisk DP/PT Doppler signals bilaterally He can follow-up with our office in 6 months with repeat left lower extremity arterial duplex and ABIs   Ahmed Holster PA-C [facility] Office: [phone]  Clinic MD: Serene

## 2023-12-19 ENCOUNTER — Other Ambulatory Visit: Payer: Self-pay

## 2023-12-19 DIAGNOSIS — I70213 Atherosclerosis of native arteries of extremities with intermittent claudication, bilateral legs: Secondary | ICD-10-CM

## 2024-02-15 LAB — LAB REPORT - SCANNED
A1c: 7.3
EGFR: 70.2

## 2024-04-21 ENCOUNTER — Ambulatory Visit: Payer: Self-pay | Admitting: Endocrinology

## 2024-04-21 ENCOUNTER — Encounter: Payer: Self-pay | Admitting: Endocrinology

## 2024-04-21 ENCOUNTER — Ambulatory Visit: Payer: PRIVATE HEALTH INSURANCE | Admitting: Endocrinology

## 2024-04-21 VITALS — BP 130/58 | HR 83 | Resp 16 | Ht 72.0 in | Wt 303.4 lb

## 2024-04-21 DIAGNOSIS — Z7984 Long term (current) use of oral hypoglycemic drugs: Secondary | ICD-10-CM

## 2024-04-21 DIAGNOSIS — E1165 Type 2 diabetes mellitus with hyperglycemia: Secondary | ICD-10-CM

## 2024-04-21 DIAGNOSIS — Z794 Long term (current) use of insulin: Secondary | ICD-10-CM | POA: Diagnosis not present

## 2024-04-21 LAB — POCT GLYCOSYLATED HEMOGLOBIN (HGB A1C): Hemoglobin A1C: 7.4 % — AB (ref 4.0–5.6)

## 2024-04-21 MED ORDER — TOUJEO SOLOSTAR 300 UNIT/ML ~~LOC~~ SOPN: 74.0000 [IU] | PEN_INJECTOR | Freq: Every day | SUBCUTANEOUS | 4 refills | Status: AC

## 2024-04-21 NOTE — Patient Instructions (Signed)
 I)Increase Toujeo  74 units daily.  Advised to monitor blood sugar in the morning fasting and at bedtime.  If the fasting blood sugar is persistently less than 100 advised to decrease Toujeo  to 66 units daily. II) continue glipizide  XL 10 mg daily. III) continue metformin  extended release 2000 mg daily. V) continue pioglitazone /Actos  30 mg daily.

## 2024-04-21 NOTE — Progress Notes (Unsigned)
 "  Outpatient Endocrinology Note Candida Vetter, MD  04/21/2024  Patient's Name: George Webb    DOB: [birth year]    MRN: [id number]                                                    REASON OF VISIT: Follow-up for type 2 diabetes mellitus  REFERRING PROVIDER: Jolee Greig DEL, PA-C  PCP: Jolee Greig DEL, PA-C  HISTORY OF PRESENT ILLNESS:   George Webb is a 68 y.o. old male with past medical history listed below, is here for follow-up for type 2 diabetes mellitus.   Pertinent Diabetes History: Patient was diagnosed with type 2 diabetes mellitus.  Patient has uncontrolled type 2 diabetes mellitus with hemoglobin A1c in the range of 9-10 send report to endocrinology for evaluation and management.  Patient reports he had tried different anti diabetic medications including injectable in the past, does not recall the details name and no records available to review.  Hemoglobin A1c in December 2024 was 10.5%.  He had taken cortisone injection into the back and prednisone prior to the that time.  Ischial visit was in March 28, 2023.  Patient reports had seen dietitian in the past from PCP office.  Previous diabetes education: Yes   Family h/o diabetes mellitus: no.  Denies family history of diabetes mellitus.  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 Younger sister with history of pancreatic cancer.   Chronic Diabetes Complications : Retinopathy: unknown. Last ophthalmology exam was done on Due.  Nephropathy: no, on ACE/ARB / Losartan Peripheral neuropathy: yes, on , has PAD, following with vascular. On gabapentin 600 mg bedtime. Abdominal aortic aneurysm.  Coronary artery disease: no Stroke: no  Relevant comorbidities and cardiovascular risk factors: Obesity: yes Body mass index is 41.15 kg/m.  Hypertension: Yes  Hyperlipidemia : Yes, on statin   Current / Home Diabetic regimen includes:  Toujeo  70 nits daily at bedtime.  Glipizide  XL  10 mg at bedtime.  Metformin  XR 2000 mg daily at bedtime.  Actos /pioglitazone  30 mg daily.  Prior diabetic medications: ? Tresiba : sickness Jardiance , switched to farxiga .  Trulicity he had taken in the past caused stomach upset and pain and he felt like he had pancreatitis. Farxiga  stopped due to scrotal abscess in November 2025. Will avoid GLP-1 receptor agonist due to history of pancreatitis multiple times in 1990s and family history of pancreatic cancer in younger sister.  Glycemic data:   No glucose data to review. He has [employer] glucometer.  He does not prefer to have CGM.  He recalls blood sugar in the morning fasting 120-130 range.  Hypoglycemia: Patient has no hypoglycemic episodes. Patient has hypoglycemia awareness.  Factors modifying glucose control: 1.  Diabetic diet assessment: 3 meals a day, snacks in between the meals.  He likes to drink a regular soft drink, potato pasta and bread.  He adds 3 to 4 packets of sugar in the coffee.  2.  Staying active or exercising: No formal exercise.  Limited physical activity at home.  3.  Medication compliance: compliant most of the time.  Interval history  Patient reports he had a scrotal abscess in the beginning of November and Farxiga  was stopped.  Diabetes regimen is reviewed and noted above.  Hemoglobin A1c slightly worsened to 7.4% today.  No glucose data to review however he recalls blood sugar in the early morning fasting 120-130 range.  Denies hypoglycemia.  Lately he reports he has limited physical activities mostly sedentary and eating also not as great.  No other complaints today.  REVIEW OF SYSTEMS As per history of present illness.   PAST MEDICAL HISTORY: Past Medical History:  Diagnosis Date   AAA (abdominal aortic aneurysm)    Diabetes mellitus    Hyperlipidemia    Hypertension    Pancreatitis     PAST SURGICAL HISTORY: Past Surgical History:  Procedure Laterality Date   ABDOMINAL AORTIC ANEURYSM REPAIR   02-04-2009   EVAR   ABDOMINAL AORTOGRAM N/A 10/23/2023   Procedure: ABDOMINAL AORTOGRAM;  Surgeon: Serene Gaile ORN, MD;  Location: [facility];  Service: Cardiovascular;  Laterality: N/A;   ABDOMINAL AORTOGRAM W/LOWER EXTREMITY N/A 03/25/2019   Procedure: ABDOMINAL AORTOGRAM W/LOWER EXTREMITY;  Surgeon: Serene Gaile ORN, MD;  Location: [facility];  Service: Cardiovascular;  Laterality: N/A;   CHOLECYSTECTOMY  Sept. 2008   LOWER EXTREMITY ANGIOGRAPHY Left 10/23/2023   Procedure: Lower Extremity Angiography;  Surgeon: Serene Gaile ORN, MD;  Location: [facility];  Service: Cardiovascular;  Laterality: Left;   LOWER EXTREMITY INTERVENTION Left 10/23/2023   Procedure: LOWER EXTREMITY INTERVENTION;  Surgeon: Serene Gaile ORN, MD;  Location: [facility];  Service: Cardiovascular;  Laterality: Left;   PERIPHERAL VASCULAR ATHERECTOMY Left 03/25/2019   Procedure: PERIPHERAL VASCULAR ATHERECTOMY;  Surgeon: Serene Gaile ORN, MD;  Location: [facility];  Service: Cardiovascular;  Laterality: Left;  superficiacl femoral   PERIPHERAL VASCULAR INTERVENTION Left 03/25/2019   Procedure: PERIPHERAL VASCULAR INTERVENTION;  Surgeon: Serene Gaile ORN, MD;  Location: [facility];  Service: Cardiovascular;  Laterality: Left;  superficial femoral    ALLERGIES: Allergies  Allergen Reactions   Sulfa Antibiotics Nausea Only   Cephalexin Diarrhea    FAMILY HISTORY:  Family History  Problem Relation Age of Onset   Heart disease Mother    Diabetes Father    Cancer Sister     SOCIAL HISTORY: Social History   Socioeconomic History   Marital status: Married    Spouse name: Not on file   Number of children: Not on file   Years of education: Not on file   Highest education level: Not on file  Occupational History   Not on file  Tobacco Use   Smoking status: Every Day    Current packs/day: 1.00    Average packs/day: 1 pack/day for 20.0 years (20.0 ttl pk-yrs)    Types:  Cigars, Cigarettes    Passive exposure: Never   Smokeless tobacco: Never   Tobacco comments:    avg 4-5 per day  Vaping Use   Vaping status: Never Used  Substance and Sexual Activity   Alcohol use: Yes    Alcohol/week: 1.0 standard drink of alcohol    Types: 1 Glasses of wine per week   Drug use: No   Sexual activity: Not on file  Other Topics Concern   Not on file  Social History Narrative   Not on file   Social Drivers of Health   Tobacco Use: High Risk (04/21/2024)   Patient History    Smoking Tobacco Use: Every Day    Smokeless Tobacco Use: Never    Passive Exposure: Never  Financial Resource Strain: Low Risk (02/28/2024)   Received from [facility]   Overall Financial Resource Strain (CARDIA)  How hard is it for you to pay for the very basics like food, housing, medical care, and heating?: Not very hard  Food Insecurity: No Food Insecurity (02/28/2024)   Received from [facility]   Epic    Within the past 12 months, you worried that your food would run out before you got the money to buy more.: Never true    Within the past 12 months, the food you bought just didn't last and you didn't have money to get more.: Never true  Transportation Needs: No Transportation Needs (02/28/2024)   Received from [facility] - Transportation    Lack of Transportation (Medical): No    Lack of Transportation (Non-Medical): No  Physical Activity: Inactive (02/28/2024)   Received from [facility]   Exercise Vital Sign    On average, how many days per week do you engage in moderate to strenuous exercise (like a brisk walk)?: 0 days    On average, how many minutes do you engage in exercise at this level?: 0 min  Stress: No Stress Concern Present (02/28/2024)   Received from [facility] - Occupational Stress Questionnaire    Do you feel stress - tense, restless, nervous, or anxious, or unable to sleep at night because  your mind is troubled all the time - these days?: Only a little  Social Connections: Socially Integrated (02/28/2024)   Received from [facility]   Social Connection and Isolation Panel    In a typical week, how many times do you talk on the phone with family, friends, or neighbors?: Three times a week    How often do you get together with friends or relatives?: Twice a week    How often do you attend church or religious services?: 1 to 4 times per year    Do you belong to any clubs or organizations such as church groups, unions, fraternal or athletic groups, or school groups?: Yes    How often do you attend meetings of the clubs or organizations you belong to?: 1 to 4 times per year    Are you married, widowed, divorced, separated, never married, or living with a partner?: Married  Depression (PHQ2-9): Not on file  Alcohol Screen: Not on file  Housing: Not on file  Utilities: Low Risk (02/28/2024)   Received from [facility]   Utilities    Within the past 12 months, have you been unable to get utilities(heat, electricity) when it was really needed?: No  Health Literacy: Medium Risk (02/28/2024)   Received from [facility] Literacy    How often do you need to have someone help you when you read instructions, pamphlets, or other written material from your doctor or pharmacy?: Rarely    MEDICATIONS:  Current Outpatient Medications  Medication Sig Dispense Refill   acetaminophen  (TYLENOL ) 650 MG CR tablet Take 1,300 mg by mouth every 8 (eight) hours as needed for pain.     amLODipine (NORVASC) 10 MG tablet Take 10 mg by mouth at bedtime.      aspirin  81 MG tablet Take 81 mg by mouth at bedtime.      atorvastatin (LIPITOR) 40 MG tablet Take 40 mg by mouth at bedtime.      Blood Glucose Monitoring Suppl DEVI 1 each by Does not apply route in the morning, at noon, and at bedtime. May substitute to any manufacturer covered by  patient's insurance. 1 each 0   chlorpheniramine  (CHLOR-TRIMETON) 4 MG tablet Take 4 mg by mouth at bedtime.     clopidogrel  (PLAVIX ) 75 MG tablet Take 1 tablet (75 mg total) by mouth daily. 30 tablet 11   gabapentin (NEURONTIN) 300 MG capsule Take 600 mg by mouth at bedtime.     GLIPIZIDE  XL 10 MG 24 hr tablet Take 1 tablet (10 mg total) by mouth at bedtime. 90 tablet 3   Glucose Blood (BLOOD GLUCOSE TEST STRIPS) STRP 1 each by In Vitro route in the morning, at noon, and at bedtime. May substitute to any manufacturer covered by patient's insurance. 100 each 3   insulin glargine , 1 Unit Dial, (TOUJEO  SOLOSTAR) 300 UNIT/ML Solostar Pen Inject 74 Units into the skin at bedtime. 50 mL 4   losartan (COZAAR) 100 MG tablet Take 100 mg by mouth daily.     metFORMIN  (GLUCOPHAGE -XR) 500 MG 24 hr tablet Take 2 tablets (1,000 mg total) by mouth 2 (two) times daily with a meal. (Patient taking differently: Take 2,000 mg by mouth at bedtime.) 360 tablet 3   metoprolol succinate (TOPROL-XL) 25 MG 24 hr tablet Take 1 tablet by mouth daily.     naproxen sodium (ALEVE) 220 MG tablet Take 440 mg by mouth at bedtime.     pioglitazone  (ACTOS ) 30 MG tablet Take 1 tablet (30 mg total) by mouth daily. 90 tablet 3   risankizumab-rzaa (SKYRIZI) 150 MG/ML SOSY prefilled syringe Inject 150 mg into the skin every 3 (three) months.     No current facility-administered medications for this visit.    PHYSICAL EXAM: Vitals:   04/21/24 1540  BP: (!) 130/58  Pulse: 83  Resp: 16  SpO2: 96%  Weight: (!) 303 lb 6.4 oz (137.6 kg)  Height: 6' (1.829 m)     Body mass index is 41.15 kg/m.  Wt Readings from Last 3 Encounters:  04/21/24 (!) 303 lb 6.4 oz (137.6 kg)  12/17/23 297 lb (134.7 kg)  12/12/23 297 lb 6.4 oz (134.9 kg)    General: Well developed, well nourished male in no apparent distress.  HEENT: AT/[place], no external lesions.  Eyes: Conjunctiva clear and no icterus. Neck: Neck supple  Lungs: Respirations not labored Neurologic: Alert, oriented, normal  speech Extremities / Skin: Dry.  Psychiatric: Does not appear depressed or anxious  Diabetic Foot Exam - Simple   Simple Foot Form Diabetic Foot exam was performed with the following findings: Yes 04/21/2024  3:53 PM  Visual Inspection No deformities, no ulcerations, no other skin breakdown bilaterally: Yes Sensation Testing See comments: Yes Pulse Check Posterior Tibialis and Dorsalis pulse intact bilaterally: Yes Comments Left foot monofilament absent on toes and intact on sole. Right foot mildly diminished on toes and intact on soles. Dry feet, no deformity.      LABS Reviewed Lab Results  Component Value Date   HGBA1C 7.4 (A) 04/21/2024   HGBA1C 6.8 (A) 12/12/2023   HGBA1C 7.3 (A) 09/04/2023   No results found for: FRUCTOSAMINE No results found for: CHOL, HDL, LDLCALC, LDLDIRECT, TRIG, CHOLHDL Lab Results  Component Value Date   MICRALBCREAT 719 (H) 09/04/2023   MICRALBCREAT 1,173 (H) 03/28/2023   Lab Results  Component Value Date   CREATININE 1.10 10/23/2023   No results found for: GFR  ASSESSMENT / PLAN  1. Uncontrolled type 2 diabetes mellitus with hyperglycemia (HCC)      Diabetes Mellitus type 2, complicated by diabetic neuropathy / PAD.  - Diabetic status /  severity: uncontrolled worsening.  Lab Results  Component Value Date   HGBA1C 7.4 (A) 04/21/2024    - Hemoglobin A1c goal : <6.5%  - Medications: See below.  No change.  I) increase Toujeo  70 to 74 units daily.  Advised to monitor blood sugar in the morning fasting and at bedtime.  If the fasting blood sugar is persistently less than 100 advised to decrease Toujeo  to 66 units daily. II) continue glipizide  XL 10 mg daily. III) continue metformin  extended release 1000 mg with meals 2 times a day.  IV) continue pioglitazone /Actos  30 mg daily.   - Home glucose testing: At least 2 times a day in the morning fasting and bedtime.  Asked to monitor blood sugar and bring glucometer in  the follow-up visit.   - Discussed/ Gave Hypoglycemia treatment plan.  # Consult : not required at this time.   # Annual urine for microalbuminuria/ creatinine ratio,high microalbuminuria currently, continue ACE/ARB/losartan.  Used to be on Farxiga /SGLT2 inhibitor he stopped due to scrotal abscess. Last  Lab Results  Component Value Date   MICRALBCREAT 719 (H) 09/04/2023    # Foot check nightly / neuropathy, continue gabapentin 600 mg at bedtime, managed by PCP.  # Annual dilated diabetic eye exams.  Advised for diabetic eye exam.  He is due for it.  - Diet: Make healthy diabetic food choices, patient had seen dietitian locally in the past. - Life style / activity / exercise: Discussed.   2. Blood pressure  -  BP Readings from Last 1 Encounters:  04/21/24 (!) 130/58    - Control is in target.  - No change in current plans.  3. Lipid status / Hyperlipidemia - Last No results found for: LDLCALC - Continue atorvastatin 40 mg daily, managed by PCP.  Diagnoses and all orders for this visit:  Uncontrolled type 2 diabetes mellitus with hyperglycemia (HCC) -     POCT glycosylated hemoglobin (Hb A1C) -     insulin glargine , 1 Unit Dial, (TOUJEO  SOLOSTAR) 300 UNIT/ML Solostar Pen; Inject 74 Units into the skin at bedtime.    DISPOSITION Follow up in clinic in 4 months suggested.   All questions answered and patient verbalized understanding of the plan.  Momina Hunton, MD [facility] Endocrinology [facility] [address] [place], [address] [place], [place] [address] Phone # [phone]  At least part of this note was generated using voice recognition software. Inadvertent word errors may have occurred, which were not recognized during the proofreading process. "

## 2024-06-16 ENCOUNTER — Encounter (HOSPITAL_COMMUNITY): Payer: PRIVATE HEALTH INSURANCE

## 2024-06-16 ENCOUNTER — Ambulatory Visit: Payer: PRIVATE HEALTH INSURANCE

## 2024-08-18 ENCOUNTER — Ambulatory Visit: Admitting: Endocrinology
